# Patient Record
Sex: Male | Born: 1935 | Race: White | Hispanic: No | Marital: Married | Smoking: Never smoker
Health system: Southern US, Community
[De-identification: ages and names within clinical notes are randomized; demographics above are authoritative.]

## PROBLEM LIST (undated history)

## (undated) DIAGNOSIS — R0989 Other specified symptoms and signs involving the circulatory and respiratory systems: Secondary | ICD-10-CM

## (undated) DIAGNOSIS — C4491 Basal cell carcinoma of skin, unspecified: Secondary | ICD-10-CM

## (undated) DIAGNOSIS — I350 Nonrheumatic aortic (valve) stenosis: Secondary | ICD-10-CM

## (undated) DIAGNOSIS — C61 Malignant neoplasm of prostate: Secondary | ICD-10-CM

## (undated) DIAGNOSIS — I25118 Atherosclerotic heart disease of native coronary artery with other forms of angina pectoris: Secondary | ICD-10-CM

## (undated) DIAGNOSIS — E785 Hyperlipidemia, unspecified: Secondary | ICD-10-CM

## (undated) DIAGNOSIS — N1831 Chronic kidney disease, stage 3a: Secondary | ICD-10-CM

## (undated) DIAGNOSIS — D374 Neoplasm of uncertain behavior of colon: Secondary | ICD-10-CM

## (undated) DIAGNOSIS — C439 Malignant melanoma of skin, unspecified: Secondary | ICD-10-CM

## (undated) HISTORY — DX: Other specified symptoms and signs involving the circulatory and respiratory systems: R09.89

## (undated) HISTORY — DX: Nonrheumatic aortic (valve) stenosis: I35.0

## (undated) HISTORY — PX: COLONOSCOPY: SHX174

## (undated) HISTORY — DX: Atherosclerotic heart disease of native coronary artery with other forms of angina pectoris: I25.118

## (undated) HISTORY — DX: Malignant melanoma of skin, unspecified: C43.9

## (undated) HISTORY — PX: CARDIAC CATHETERIZATION: SHX172

## (undated) HISTORY — PX: INSERTION PROSTATE RADIATION SEED: SUR718

## (undated) HISTORY — DX: Basal cell carcinoma of skin, unspecified: C44.91

## (undated) HISTORY — PX: TONSILLECTOMY: SUR1361

## (undated) HISTORY — DX: Malignant neoplasm of prostate: C61

## (undated) HISTORY — DX: Hyperlipidemia, unspecified: E78.5

## (undated) HISTORY — DX: Chronic kidney disease, stage 3a: N18.31

## (undated) HISTORY — PX: ROTATOR CUFF REPAIR: SHX139

## (undated) HISTORY — DX: Neoplasm of uncertain behavior of colon: D37.4

---

## 2004-10-21 ENCOUNTER — Ambulatory Visit: Payer: Self-pay | Admitting: Unknown Physician Specialty

## 2005-09-11 ENCOUNTER — Ambulatory Visit: Payer: Self-pay | Admitting: Ophthalmology

## 2007-04-27 ENCOUNTER — Emergency Department: Payer: Self-pay | Admitting: Emergency Medicine

## 2008-01-15 ENCOUNTER — Ambulatory Visit: Payer: Self-pay | Admitting: Unknown Physician Specialty

## 2008-10-22 ENCOUNTER — Ambulatory Visit: Payer: Self-pay | Admitting: Radiation Oncology

## 2008-11-12 ENCOUNTER — Ambulatory Visit: Payer: Self-pay | Admitting: Radiation Oncology

## 2008-11-21 ENCOUNTER — Ambulatory Visit: Payer: Self-pay | Admitting: Radiation Oncology

## 2008-12-01 ENCOUNTER — Ambulatory Visit: Payer: Self-pay | Admitting: Radiation Oncology

## 2008-12-08 ENCOUNTER — Ambulatory Visit: Payer: Self-pay | Admitting: Cardiology

## 2008-12-08 ENCOUNTER — Ambulatory Visit: Payer: Self-pay | Admitting: Urology

## 2008-12-15 ENCOUNTER — Ambulatory Visit: Payer: Self-pay | Admitting: Urology

## 2008-12-22 ENCOUNTER — Ambulatory Visit: Payer: Self-pay | Admitting: Radiation Oncology

## 2009-01-21 ENCOUNTER — Ambulatory Visit: Payer: Self-pay | Admitting: Radiation Oncology

## 2009-03-24 ENCOUNTER — Ambulatory Visit: Payer: Self-pay | Admitting: Radiation Oncology

## 2009-04-16 ENCOUNTER — Ambulatory Visit: Payer: Self-pay | Admitting: Radiation Oncology

## 2009-04-23 ENCOUNTER — Ambulatory Visit: Payer: Self-pay | Admitting: Radiation Oncology

## 2009-09-21 ENCOUNTER — Ambulatory Visit: Payer: Self-pay | Admitting: Radiation Oncology

## 2009-10-14 ENCOUNTER — Ambulatory Visit: Payer: Self-pay | Admitting: Radiation Oncology

## 2009-10-22 ENCOUNTER — Ambulatory Visit: Payer: Self-pay | Admitting: Radiation Oncology

## 2010-03-24 ENCOUNTER — Ambulatory Visit: Payer: Self-pay | Admitting: Radiation Oncology

## 2010-04-05 ENCOUNTER — Ambulatory Visit: Payer: Self-pay | Admitting: Internal Medicine

## 2010-04-15 ENCOUNTER — Ambulatory Visit: Payer: Self-pay | Admitting: Radiation Oncology

## 2010-04-23 ENCOUNTER — Ambulatory Visit: Payer: Self-pay | Admitting: Radiation Oncology

## 2010-09-22 HISTORY — PX: MELANOMA EXCISION: SHX5266

## 2011-04-14 ENCOUNTER — Ambulatory Visit: Payer: Self-pay | Admitting: Radiation Oncology

## 2011-04-15 LAB — PSA: PSA: 0.7 ng/mL (ref 0.0–4.0)

## 2011-04-24 ENCOUNTER — Ambulatory Visit: Payer: Self-pay | Admitting: Radiation Oncology

## 2011-05-23 ENCOUNTER — Ambulatory Visit: Payer: Self-pay | Admitting: Cardiology

## 2012-04-15 ENCOUNTER — Ambulatory Visit: Payer: Self-pay | Admitting: Radiation Oncology

## 2012-04-16 LAB — PSA: PSA: 0.9 ng/mL (ref 0.0–4.0)

## 2012-04-23 ENCOUNTER — Ambulatory Visit: Payer: Self-pay | Admitting: Radiation Oncology

## 2013-02-21 ENCOUNTER — Ambulatory Visit: Payer: Self-pay | Admitting: Unknown Physician Specialty

## 2013-04-14 ENCOUNTER — Ambulatory Visit: Payer: Self-pay | Admitting: Radiation Oncology

## 2013-04-23 ENCOUNTER — Ambulatory Visit: Payer: Self-pay | Admitting: Radiation Oncology

## 2014-02-02 ENCOUNTER — Ambulatory Visit: Payer: Self-pay | Admitting: Ophthalmology

## 2014-02-09 ENCOUNTER — Ambulatory Visit: Payer: Self-pay | Admitting: Ophthalmology

## 2014-04-14 ENCOUNTER — Ambulatory Visit: Payer: Self-pay | Admitting: Radiation Oncology

## 2014-04-23 ENCOUNTER — Ambulatory Visit: Payer: Self-pay | Admitting: Radiation Oncology

## 2014-07-13 ENCOUNTER — Ambulatory Visit: Payer: Self-pay | Admitting: Surgery

## 2014-08-12 ENCOUNTER — Ambulatory Visit: Payer: Self-pay | Admitting: Surgery

## 2014-08-18 ENCOUNTER — Ambulatory Visit: Payer: Self-pay | Admitting: Surgery

## 2014-11-14 NOTE — Op Note (Signed)
PATIENT NAME:  James West, James West MR#:  [id number] DATE OF BIRTH:  10/12/1931  DATE OF PROCEDURE:  02/09/2014  PREOPERATIVE DIAGNOSIS:  Cataract, right eye.   POSTOPERATIVE DIAGNOSIS:  Cataract, right eye.  PROCEDURE PERFORMED:  Extracapsular cataract extraction using phacoemulsification with placement of an Alcon [id number], 17.5-diopter posterior chamber lens, serial [id number].  SURGEON:  Loura Back. Tameah Mihalko, MD  ASSISTANT:  None.  ANESTHESIA:  4% lidocaine and 0.75% Marcaine in a 50/50 mixture with 10 units/mL of Hylenex added, given as a peribulbar.   ANESTHESIOLOGIST:  Dr. Benjamine Mola  COMPLICATIONS:  None.  ESTIMATED BLOOD LOSS:  Less than 1 ml.  DESCRIPTION OF PROCEDURE:  The patient was brought to the operating room and given a peribulbar block.  The patient was then prepped and draped in the usual fashion.  The vertical rectus muscles were imbricated using 5-0 silk sutures.  These sutures were then clamped to the sterile drapes as bridle sutures.  A limbal peritomy was performed extending two clock hours and hemostasis was obtained with cautery.  A partial thickness scleral groove was made at the surgical limbus and dissected anteriorly in a lamellar dissection using an Alcon crescent knife.  The anterior chamber was entered superonasally with a Superblade and through the lamellar dissection with a 2.6 mm keratome.  DisCoVisc was used to replace the aqueous and a continuous tear capsulorrhexis was carried out.  Hydrodissection and hydrodelineation were carried out with balanced salt and a 27 gauge canula.  The nucleus was rotated to confirm the effectiveness of the hydrodissection.  Phacoemulsification was carried out using a divide-and-conquer technique.  Total ultrasound time was 59.7 seconds with an average power of 23.7 percent and CDE of 24.89.  Irrigation/aspiration was used to remove the residual cortex.  DisCoVisc was used to inflate the capsule and the internal incision was enlarged  to 3 mm with the crescent knife.  The intraocular lens was folded and inserted into the capsular bag using the AcrySert delivery system. Irrigation/aspiration was used to remove the residual DisCoVisc.  Miostat was injected into the anterior chamber through the paracentesis track to inflate the anterior chamber and induce miosis. A tenth of a milliliter of cefuroxime was injected via the paracentesis tract containing 1 mg of drug. The wound was checked for leaks and none were found. The conjunctiva was closed with cautery and the bridle sutures were removed.  Two drops of 0.3% Vigamox were placed on the eye.   An eye shield was placed on the eye.  The patient was discharged to the recovery room in good condition.  ____________________________ Loura Back Tahani Potier, MD sad:sb D: 02/09/2014 10:50:32 ET T: 02/09/2014 11:11:35 ET JOB#: [id number]  cc: Remo Lipps A. Nevia Henkin, MD, <Dictator> Martie Lee MD ELECTRONICALLY SIGNED 02/09/2014 12:35

## 2014-11-22 NOTE — Op Note (Signed)
PATIENT NAME:  James West, James West MR#:  [id number] DATE OF BIRTH:  04/24/1932  DATE OF PROCEDURE:  08/18/2014  PREOPERATIVE DIAGNOSIS: Large rotator cuff tear, right shoulder.   POSTOPERATIVE DIAGNOSIS: Large rotator cuff tear, right shoulder.   PROCEDURE: Arthroscopic debridement, arthroscopic subacromial decompression, mini open repair of large rotator cuff tear, and biceps tenodesis right shoulder.   SURGEON: Pascal Lux, M.D.    ASSISTANT: Francena Hanly, NP.   ANESTHESIA: General endotracheal with an interscalene block placed preoperatively by the anesthesiologist.   FINDINGS: As noted above. The labrum demonstrated some mild fraying, but otherwise was intact. The biceps tendon demonstrated some tendinopathic changes, as well as some medial subluxation. The articular surfaces of the glenoid and humerus both were in satisfactory condition. There was a full-thickness tear involving the entire supraspinatus tendon, the anterior 60% to 70% of the infraspinatus tendon, and the superior insertional fibers of the subscapularis tendon.   COMPLICATIONS: None.   ESTIMATED BLOOD LOSS: 25 mL.  TOTAL FLUIDS: 700 mL of crystalloid.   TOURNIQUET: None.   DRAINS: None.   CLOSURE: Staples.   BRIEF CLINICAL NOTE: The patient is an 81 year old male with a several month history of right shoulder pain and weakness following an injury. Following the injury, he was unable to elevate his arm. His history and examination were consistent with a large rotator cuff tear confirmed by MRI scan. He presents at this time for arthroscopy and repair of the rotator cuff tear.   DESCRIPTION OF PROCEDURE: The patient underwent placement of an interscalene block in the preoperative holding area before he was brought into the operating room and lain in the supine position. After adequate general endotracheal intubation and anesthesia were obtained, he was repositioned in the beach chair position using the beach chair  positioner. The right shoulder and upper extremity were prepped with ChloraPrep solution before being draped sterilely. Preoperative antibiotics were administered. The expected portal sites and incision site were injected with 0.5% Sensorcaine with epinephrine before the camera was placed in the posterior portal. The glenohumeral joint was thoroughly inspected with the findings as described above. An anterior portal was created using an outside-in technique. The labrum was carefully probed, again confirming the above-noted findings. There was extensive synovitis anteriorly, superiorly, and posterosuperiorly. These areas were debrided back to stable margins using the full radius resector. In addition, the frayed margins of the rotator cuff also were debrided using the full radius resector. The ArthroCare wand was inserted to obtain hemostasis. The ArthroCare wand also was used to release the biceps tendon from its labral attachment as it demonstrated the above-noted tendinopathic changes and medial subluxation. Inspection of the subscapularis tendon demonstrated only the top 10% of it having torn; therefore, a formal repair was not deemed necessary. The instruments were removed from the joint after suctioning the excess fluid.   The camera was repositioned through the posterior portal into the subacromial space. A separate lateral portal was created and the full radius resector introduced. A subtotal bursectomy was performed before the ArthroCare wand was inserted to remove the periosteal tissues off the undersurface of the anterior third of the acromion. It also was used to recess the coracoacromial ligament from its attachment along the anterior and lateral margins of the acromion. The 4 mm acromionizer bur was inserted to complete the decompression by removing the undersurface of the anterior third of the acromion. The shaver was reintroduced to remove residual bony debris before the ArthroCare wand was  reinserted to obtain hemostasis.  The instruments were then removed from the subacromial space after suctioning the excess fluid.   An approximately 4 to 5 cm incision was made over the anterolateral aspect of the shoulder beginning at the anterolateral corner of the acromion, extending distally in line with the bicipital groove. The incision was carried down through the subcutaneous tissues to expose the deltoid fascia. The raphe between the anterior and middle thirds was developed to provide access into the subacromial space. Additional bursal tissues were debrided to better visualize the rotator cuff tear. The tear measured approximately 3 to 3.5 cm in anterior to posterior dimension and 3 cm in medial lateral dimension. The tear was primarily an L-shaped tear, based anteriorly. The posterior flap was grasped with an Allis clamp and pulled forward. Several tagging sutures were passed through it near the anterolateral corner to better control the rotator cuff. Several additional tagging sutures were placed anteriorly. An apical stitch was placed and tied securely to help close down the defect. The exposed greater tuberosity was roughened with a rongeur before two 2.9 mm Biomet JuggerKnot anchors were inserted. Both sets of sutures from the more posterior anchor were passed through the flap, fairly posteriorly, and tied securely to help pull the rotator cuff tissue forward and to relieve tension on the more anterior sutures. Once the sutures from the second anchor were placed through the flap, whereas the other side of sutures was divided so that one went through the flap posteriorly, and the other went through the flap anteriorly to complete a side-to-side repair. Each of these sutures were tied down to effect a visually excellent repair. The repair appeared to be watertight. For reinforcement, these sutures were brought back out laterally through bone tunnels and tied over a bony bridge to further reinforce the  repair and create a double row closure. Several additional #0 Ethibond interrupted sutures were used to further reinforce the repair.   Attention was directed to the bicipital groove. The bicipital groove was identified by palpation and opened for 1.5 cm. The biceps tendon was retrieved through this defect before the floor of the bicipital groove was roughened with a curette. A third 2.9 mm JuggerKnot anchor was inserted and both sets of sutures were passed through the biceps tendon to effect the tenodesis. The excess tendon stump proximally was removed with a 15 blade before the bicipital sheath was reapproximated using #0 Ethibond interrupted sutures, incorporating the biceps tendon to further reinforce the tenodesis.   The wound was copiously irrigated with sterile saline solution before the deltoid raphe was reapproximated using 2-0 Vicryl interrupted sutures. The subcutaneous tissues were closed in two layers using 2-0 Vicryl interrupted sutures before the skin was closed using staples. The portal sites also were closed using staples before a sterile bulky dressing was applied to the shoulder. The arm was placed into a gunslinger shoulder immobilizer before the patient was awakened, extubated, and returned to the recovery room in satisfactory condition after tolerating the procedure well.    ____________________________ J. Dorien Chihuahua, MD jjp:JT D: 08/18/2014 11:19:32 ET T: 08/18/2014 14:21:10 ET JOB#: [id number]  cc: Pascal Lux, MD, <Dictator> JEFF Robby Sermon MD ELECTRONICALLY SIGNED 08/18/2014 17:40

## 2015-11-08 DIAGNOSIS — R739 Hyperglycemia, unspecified: Secondary | ICD-10-CM | POA: Diagnosis not present

## 2015-11-08 DIAGNOSIS — E7800 Pure hypercholesterolemia, unspecified: Secondary | ICD-10-CM | POA: Diagnosis not present

## 2015-11-22 DIAGNOSIS — E7800 Pure hypercholesterolemia, unspecified: Secondary | ICD-10-CM | POA: Diagnosis not present

## 2015-11-22 DIAGNOSIS — R739 Hyperglycemia, unspecified: Secondary | ICD-10-CM | POA: Diagnosis not present

## 2015-11-22 DIAGNOSIS — Z0000 Encounter for general adult medical examination without abnormal findings: Secondary | ICD-10-CM | POA: Diagnosis not present

## 2015-11-25 DIAGNOSIS — Z85828 Personal history of other malignant neoplasm of skin: Secondary | ICD-10-CM | POA: Diagnosis not present

## 2015-11-25 DIAGNOSIS — Z08 Encounter for follow-up examination after completed treatment for malignant neoplasm: Secondary | ICD-10-CM | POA: Diagnosis not present

## 2015-11-25 DIAGNOSIS — N138 Other obstructive and reflux uropathy: Secondary | ICD-10-CM | POA: Diagnosis not present

## 2015-11-25 DIAGNOSIS — Z7982 Long term (current) use of aspirin: Secondary | ICD-10-CM | POA: Diagnosis not present

## 2015-11-25 DIAGNOSIS — Z8546 Personal history of malignant neoplasm of prostate: Secondary | ICD-10-CM | POA: Diagnosis not present

## 2015-11-25 DIAGNOSIS — N320 Bladder-neck obstruction: Secondary | ICD-10-CM | POA: Diagnosis not present

## 2015-11-25 DIAGNOSIS — M545 Low back pain: Secondary | ICD-10-CM | POA: Diagnosis not present

## 2015-11-25 DIAGNOSIS — R351 Nocturia: Secondary | ICD-10-CM | POA: Diagnosis not present

## 2015-11-25 DIAGNOSIS — R339 Retention of urine, unspecified: Secondary | ICD-10-CM | POA: Diagnosis not present

## 2015-12-30 DIAGNOSIS — J0100 Acute maxillary sinusitis, unspecified: Secondary | ICD-10-CM | POA: Diagnosis not present

## 2016-02-22 IMAGING — MR MRI OF THE RIGHT SHOULDER WITHOUT CONTRAST
5 series · 40 of 40 positions shown · non-contrast
Comparison: None.

CLINICAL DATA: Right shoulder pain for 2 weeks since a fall with
limited range of motion.

EXAM:
MRI OF THE RIGHT SHOULDER WITHOUT CONTRAST
TECHNIQUE: Multiplanar, multisequence MR imaging of the shoulder was performed.
No intravenous contrast was administered.

[Series 5: T2 fat-sat · axial · 4.0mm · 0.62mm/px · z∈[-27,+65]mm · 10 of 22 slices shown (1 of 3)]
[im 1/22]
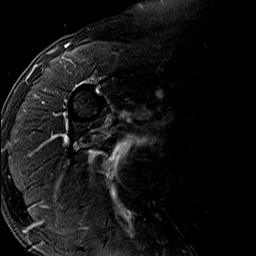
[im 3/22]
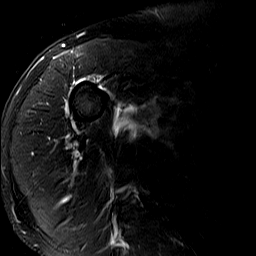
[im 5/22]
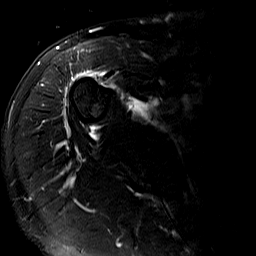
[im 8/22]
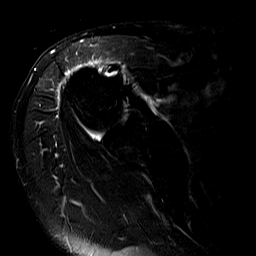
[im 10/22]
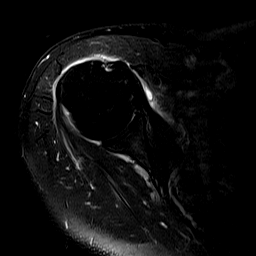
[im 12/22]
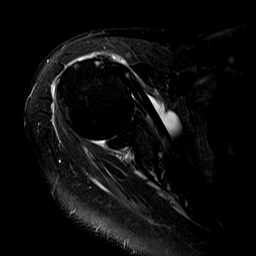
[im 15/22]
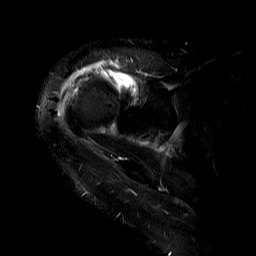
[im 17/22]
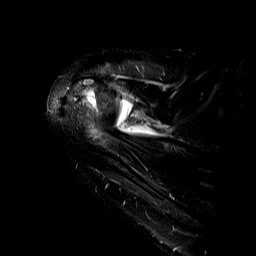
[im 19/22]
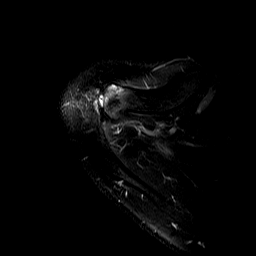
[im 22/22]
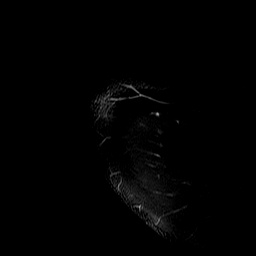

[Series 6: T2 fat-sat · oblique · 4.0mm · 0.62mm/px · 8 of 21 slices shown (2 of 3)]
[im 1/21]
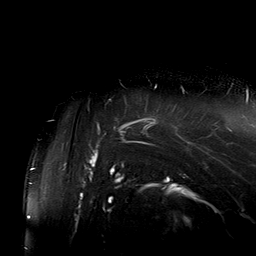
[im 3/21]
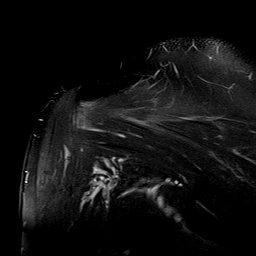
[im 6/21]
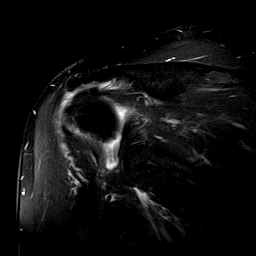
[im 9/21]
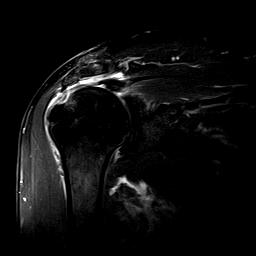
[im 12/21]
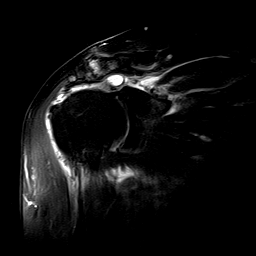
[im 15/21]
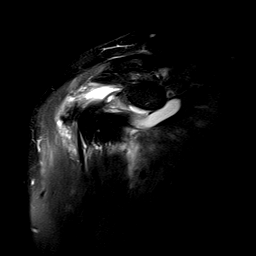
[im 18/21]
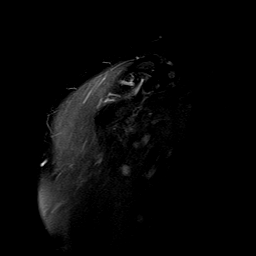
[im 21/21]
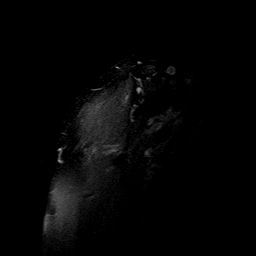

[Series 7: PD · oblique · 4.0mm · 0.62mm/px · 8 of 21 slices shown]
[im 1/21]
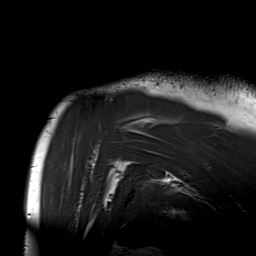
[im 3/21]
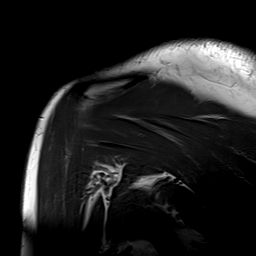
[im 6/21]
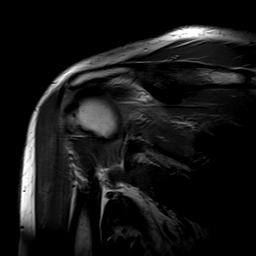
[im 9/21]
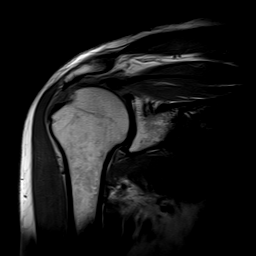
[im 12/21]
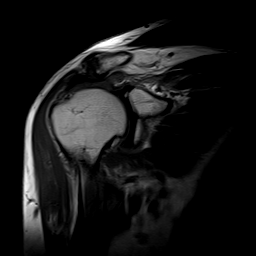
[im 15/21]
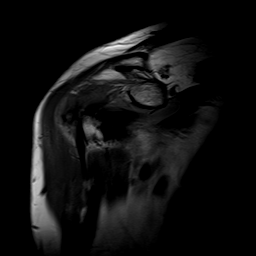
[im 18/21]
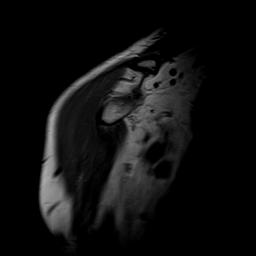
[im 21/21]
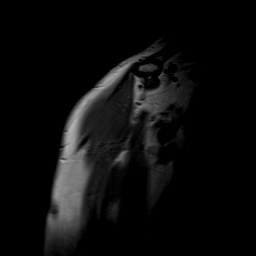

[Series 8: T1 · oblique · 4.0mm · 0.62mm/px · 7 of 19 slices shown]
[im 1/19]
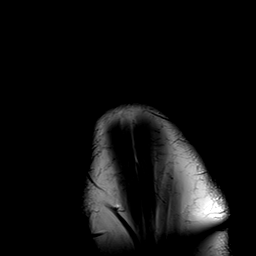
[im 4/19]
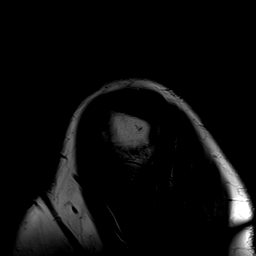
[im 7/19]
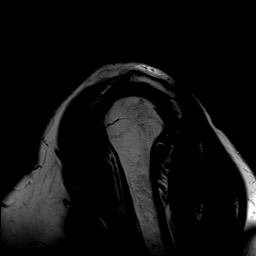
[im 10/19]
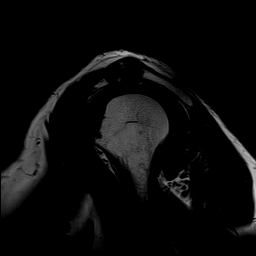
[im 13/19]
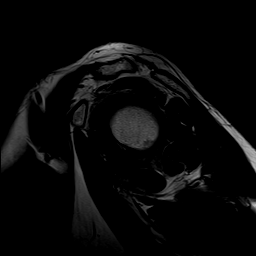
[im 16/19]
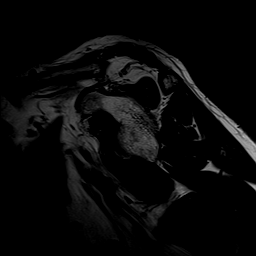
[im 19/19]
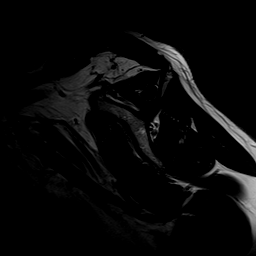

[Series 9: T2 fat-sat · oblique · 4.0mm · 0.62mm/px · 7 of 19 slices shown (3 of 3)]
[im 1/19]
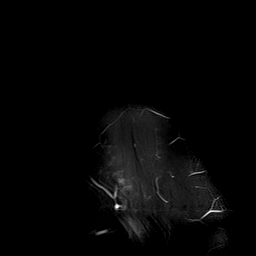
[im 4/19]
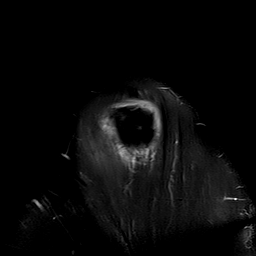
[im 7/19]
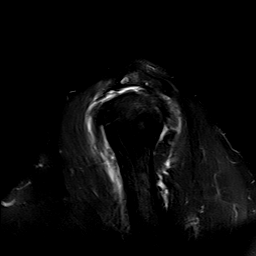
[im 10/19]
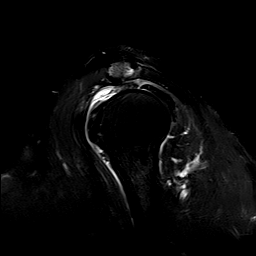
[im 13/19]
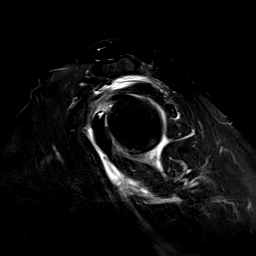
[im 16/19]
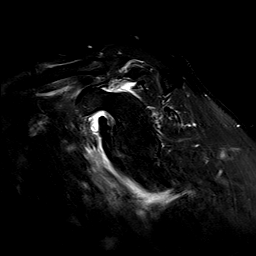
[im 19/19]
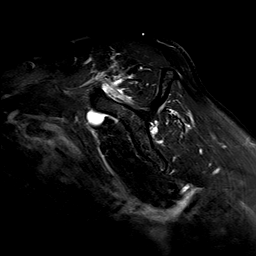

[40 of 40 positions shown; findings below may reference images not displayed]

FINDINGS: Rotator cuff: The patient has a complete supraspinatus tendon tear
with retraction just medial to the top of the humeral head, 3-4 cm.
The infraspinatus and subscapularis are intact.

Muscles:  No atrophy or focal lesion.

Biceps long head:  Intact.

Acromioclavicular Joint: Moderate to advanced degenerative change is
present.

Glenohumeral Joint: Unremarkable.

Labrum:  Intact.

Bones: The acromion is type 1 with a subacromial spur. No fracture
or worrisome marrow lesion. A large volume of fluid is present in
the subacromial/subdeltoid bursa.
IMPRESSION: Complete supraspinatus tendon tear with 3-4 cm retraction but no
atrophy.

Acromioclavicular osteoarthritis and subacromial spurring.

Subacromial/subdeltoid bursitis.

## 2016-04-14 DIAGNOSIS — Z23 Encounter for immunization: Secondary | ICD-10-CM | POA: Diagnosis not present

## 2016-08-24 DIAGNOSIS — H35373 Puckering of macula, bilateral: Secondary | ICD-10-CM | POA: Diagnosis not present

## 2016-10-19 DIAGNOSIS — H40001 Preglaucoma, unspecified, right eye: Secondary | ICD-10-CM | POA: Diagnosis not present

## 2016-10-26 DIAGNOSIS — H35373 Puckering of macula, bilateral: Secondary | ICD-10-CM | POA: Diagnosis not present

## 2017-01-31 DIAGNOSIS — R339 Retention of urine, unspecified: Secondary | ICD-10-CM | POA: Diagnosis not present

## 2017-01-31 DIAGNOSIS — N320 Bladder-neck obstruction: Secondary | ICD-10-CM | POA: Diagnosis not present

## 2017-01-31 DIAGNOSIS — Z8546 Personal history of malignant neoplasm of prostate: Secondary | ICD-10-CM | POA: Diagnosis not present

## 2017-01-31 DIAGNOSIS — Z08 Encounter for follow-up examination after completed treatment for malignant neoplasm: Secondary | ICD-10-CM | POA: Diagnosis not present

## 2017-02-12 DIAGNOSIS — R7301 Impaired fasting glucose: Secondary | ICD-10-CM | POA: Diagnosis not present

## 2017-02-12 DIAGNOSIS — E7800 Pure hypercholesterolemia, unspecified: Secondary | ICD-10-CM | POA: Diagnosis not present

## 2017-02-15 DIAGNOSIS — R739 Hyperglycemia, unspecified: Secondary | ICD-10-CM | POA: Diagnosis not present

## 2017-02-15 DIAGNOSIS — Z0000 Encounter for general adult medical examination without abnormal findings: Secondary | ICD-10-CM | POA: Diagnosis not present

## 2017-04-13 DIAGNOSIS — Z23 Encounter for immunization: Secondary | ICD-10-CM | POA: Diagnosis not present

## 2017-04-24 DIAGNOSIS — H40001 Preglaucoma, unspecified, right eye: Secondary | ICD-10-CM | POA: Diagnosis not present

## 2017-05-08 DIAGNOSIS — H40001 Preglaucoma, unspecified, right eye: Secondary | ICD-10-CM | POA: Diagnosis not present

## 2017-11-05 DIAGNOSIS — H40001 Preglaucoma, unspecified, right eye: Secondary | ICD-10-CM | POA: Diagnosis not present

## 2017-11-12 DIAGNOSIS — H35373 Puckering of macula, bilateral: Secondary | ICD-10-CM | POA: Diagnosis not present

## 2018-02-26 DIAGNOSIS — E7800 Pure hypercholesterolemia, unspecified: Secondary | ICD-10-CM | POA: Diagnosis not present

## 2018-02-26 DIAGNOSIS — R739 Hyperglycemia, unspecified: Secondary | ICD-10-CM | POA: Diagnosis not present

## 2018-02-26 DIAGNOSIS — N183 Chronic kidney disease, stage 3 (moderate): Secondary | ICD-10-CM | POA: Diagnosis not present

## 2018-02-26 DIAGNOSIS — Z125 Encounter for screening for malignant neoplasm of prostate: Secondary | ICD-10-CM | POA: Diagnosis not present

## 2018-02-28 DIAGNOSIS — E7800 Pure hypercholesterolemia, unspecified: Secondary | ICD-10-CM | POA: Diagnosis not present

## 2018-02-28 DIAGNOSIS — Z125 Encounter for screening for malignant neoplasm of prostate: Secondary | ICD-10-CM | POA: Diagnosis not present

## 2018-02-28 DIAGNOSIS — R739 Hyperglycemia, unspecified: Secondary | ICD-10-CM | POA: Diagnosis not present

## 2018-02-28 DIAGNOSIS — N183 Chronic kidney disease, stage 3 (moderate): Secondary | ICD-10-CM | POA: Diagnosis not present

## 2018-02-28 DIAGNOSIS — Z0000 Encounter for general adult medical examination without abnormal findings: Secondary | ICD-10-CM | POA: Diagnosis not present

## 2018-04-26 DIAGNOSIS — Z23 Encounter for immunization: Secondary | ICD-10-CM | POA: Diagnosis not present

## 2018-05-13 DIAGNOSIS — H40001 Preglaucoma, unspecified, right eye: Secondary | ICD-10-CM | POA: Diagnosis not present

## 2019-02-28 DIAGNOSIS — N183 Chronic kidney disease, stage 3 (moderate): Secondary | ICD-10-CM | POA: Diagnosis not present

## 2019-02-28 DIAGNOSIS — E7800 Pure hypercholesterolemia, unspecified: Secondary | ICD-10-CM | POA: Diagnosis not present

## 2019-02-28 DIAGNOSIS — Z125 Encounter for screening for malignant neoplasm of prostate: Secondary | ICD-10-CM | POA: Diagnosis not present

## 2019-02-28 DIAGNOSIS — R739 Hyperglycemia, unspecified: Secondary | ICD-10-CM | POA: Diagnosis not present

## 2019-03-03 DIAGNOSIS — Z1331 Encounter for screening for depression: Secondary | ICD-10-CM | POA: Diagnosis not present

## 2019-03-03 DIAGNOSIS — Z0000 Encounter for general adult medical examination without abnormal findings: Secondary | ICD-10-CM | POA: Diagnosis not present

## 2019-03-03 DIAGNOSIS — E7800 Pure hypercholesterolemia, unspecified: Secondary | ICD-10-CM | POA: Diagnosis not present

## 2019-03-03 DIAGNOSIS — N183 Chronic kidney disease, stage 3 (moderate): Secondary | ICD-10-CM | POA: Diagnosis not present

## 2019-03-03 DIAGNOSIS — R739 Hyperglycemia, unspecified: Secondary | ICD-10-CM | POA: Diagnosis not present

## 2019-03-03 DIAGNOSIS — C61 Malignant neoplasm of prostate: Secondary | ICD-10-CM | POA: Diagnosis not present

## 2019-03-03 DIAGNOSIS — Z125 Encounter for screening for malignant neoplasm of prostate: Secondary | ICD-10-CM | POA: Diagnosis not present

## 2019-04-11 DIAGNOSIS — Z23 Encounter for immunization: Secondary | ICD-10-CM | POA: Diagnosis not present

## 2019-07-31 ENCOUNTER — Ambulatory Visit: Payer: PPO | Attending: Internal Medicine

## 2019-07-31 DIAGNOSIS — Z20822 Contact with and (suspected) exposure to covid-19: Secondary | ICD-10-CM

## 2019-08-02 LAB — NOVEL CORONAVIRUS, NAA: SARS-CoV-2, NAA: DETECTED — AB

## 2019-08-03 ENCOUNTER — Telehealth: Payer: Self-pay | Admitting: Infectious Diseases

## 2019-08-03 NOTE — Telephone Encounter (Signed)
Called to discuss with patient about Covid symptoms and the use of bamlanivimab, a monoclonal antibody infusion for those with mild to moderate Covid symptoms and at a high risk of hospitalization.  Pt is qualified for this infusion at the [facility] infusion center due to Age > 76   He said he is not really having any symptoms at this time. The only symptom he had was a "touch of a chill" last week when he. Declined monoclonal Ab.   His wife has parkinson's and no other symptoms at this time - discussed incubation and to be watchful for her x 14 days from when he started showing symptoms.   Also discussed vaccination post COVID - would recommend waiting at least 30 days minimum prior to researching updates on timing of vaccine post COVID infection.

## 2019-08-15 DIAGNOSIS — R1013 Epigastric pain: Secondary | ICD-10-CM | POA: Diagnosis not present

## 2019-08-27 DIAGNOSIS — C44319 Basal cell carcinoma of skin of other parts of face: Secondary | ICD-10-CM | POA: Diagnosis not present

## 2019-08-27 DIAGNOSIS — D485 Neoplasm of uncertain behavior of skin: Secondary | ICD-10-CM | POA: Diagnosis not present

## 2019-08-27 DIAGNOSIS — C44719 Basal cell carcinoma of skin of left lower limb, including hip: Secondary | ICD-10-CM | POA: Diagnosis not present

## 2019-08-27 DIAGNOSIS — C44519 Basal cell carcinoma of skin of other part of trunk: Secondary | ICD-10-CM | POA: Diagnosis not present

## 2019-08-27 DIAGNOSIS — D0439 Carcinoma in situ of skin of other parts of face: Secondary | ICD-10-CM | POA: Diagnosis not present

## 2019-08-27 DIAGNOSIS — L821 Other seborrheic keratosis: Secondary | ICD-10-CM | POA: Diagnosis not present

## 2019-08-27 DIAGNOSIS — C4372 Malignant melanoma of left lower limb, including hip: Secondary | ICD-10-CM | POA: Diagnosis not present

## 2019-09-18 DIAGNOSIS — D485 Neoplasm of uncertain behavior of skin: Secondary | ICD-10-CM | POA: Diagnosis not present

## 2019-09-18 DIAGNOSIS — C4372 Malignant melanoma of left lower limb, including hip: Secondary | ICD-10-CM | POA: Diagnosis not present

## 2019-09-24 DIAGNOSIS — Z4801 Encounter for change or removal of surgical wound dressing: Secondary | ICD-10-CM | POA: Diagnosis not present

## 2019-10-02 DIAGNOSIS — C44519 Basal cell carcinoma of skin of other part of trunk: Secondary | ICD-10-CM | POA: Diagnosis not present

## 2019-10-02 DIAGNOSIS — C44719 Basal cell carcinoma of skin of left lower limb, including hip: Secondary | ICD-10-CM | POA: Diagnosis not present

## 2019-10-02 DIAGNOSIS — C44319 Basal cell carcinoma of skin of other parts of face: Secondary | ICD-10-CM | POA: Diagnosis not present

## 2019-10-08 DIAGNOSIS — Z23 Encounter for immunization: Secondary | ICD-10-CM | POA: Diagnosis not present

## 2019-10-09 DIAGNOSIS — C44319 Basal cell carcinoma of skin of other parts of face: Secondary | ICD-10-CM | POA: Diagnosis not present

## 2019-10-27 DIAGNOSIS — D0439 Carcinoma in situ of skin of other parts of face: Secondary | ICD-10-CM | POA: Diagnosis not present

## 2019-11-18 DIAGNOSIS — L249 Irritant contact dermatitis, unspecified cause: Secondary | ICD-10-CM | POA: Diagnosis not present

## 2020-01-09 DIAGNOSIS — Z08 Encounter for follow-up examination after completed treatment for malignant neoplasm: Secondary | ICD-10-CM | POA: Diagnosis not present

## 2020-01-09 DIAGNOSIS — Z85828 Personal history of other malignant neoplasm of skin: Secondary | ICD-10-CM | POA: Diagnosis not present

## 2020-01-09 DIAGNOSIS — D485 Neoplasm of uncertain behavior of skin: Secondary | ICD-10-CM | POA: Diagnosis not present

## 2020-01-09 DIAGNOSIS — L821 Other seborrheic keratosis: Secondary | ICD-10-CM | POA: Diagnosis not present

## 2020-01-09 DIAGNOSIS — L820 Inflamed seborrheic keratosis: Secondary | ICD-10-CM | POA: Diagnosis not present

## 2020-01-09 DIAGNOSIS — C44519 Basal cell carcinoma of skin of other part of trunk: Secondary | ICD-10-CM | POA: Diagnosis not present

## 2020-01-09 DIAGNOSIS — Z85820 Personal history of malignant melanoma of skin: Secondary | ICD-10-CM | POA: Diagnosis not present

## 2020-01-09 DIAGNOSIS — C44612 Basal cell carcinoma of skin of right upper limb, including shoulder: Secondary | ICD-10-CM | POA: Diagnosis not present

## 2020-01-12 DIAGNOSIS — C61 Malignant neoplasm of prostate: Secondary | ICD-10-CM | POA: Diagnosis not present

## 2020-01-12 DIAGNOSIS — R634 Abnormal weight loss: Secondary | ICD-10-CM | POA: Diagnosis not present

## 2020-01-12 DIAGNOSIS — C4372 Malignant melanoma of left lower limb, including hip: Secondary | ICD-10-CM | POA: Diagnosis not present

## 2020-01-27 DIAGNOSIS — C44519 Basal cell carcinoma of skin of other part of trunk: Secondary | ICD-10-CM | POA: Diagnosis not present

## 2020-01-27 DIAGNOSIS — C44612 Basal cell carcinoma of skin of right upper limb, including shoulder: Secondary | ICD-10-CM | POA: Diagnosis not present

## 2020-02-26 DIAGNOSIS — Z125 Encounter for screening for malignant neoplasm of prostate: Secondary | ICD-10-CM | POA: Diagnosis not present

## 2020-02-26 DIAGNOSIS — E7800 Pure hypercholesterolemia, unspecified: Secondary | ICD-10-CM | POA: Diagnosis not present

## 2020-02-26 DIAGNOSIS — R739 Hyperglycemia, unspecified: Secondary | ICD-10-CM | POA: Diagnosis not present

## 2020-02-26 DIAGNOSIS — N183 Chronic kidney disease, stage 3 unspecified: Secondary | ICD-10-CM | POA: Diagnosis not present

## 2020-03-04 DIAGNOSIS — Z0000 Encounter for general adult medical examination without abnormal findings: Secondary | ICD-10-CM | POA: Diagnosis not present

## 2020-03-04 DIAGNOSIS — N1831 Chronic kidney disease, stage 3a: Secondary | ICD-10-CM | POA: Diagnosis not present

## 2020-03-04 DIAGNOSIS — Z1331 Encounter for screening for depression: Secondary | ICD-10-CM | POA: Diagnosis not present

## 2020-03-04 DIAGNOSIS — Z125 Encounter for screening for malignant neoplasm of prostate: Secondary | ICD-10-CM | POA: Diagnosis not present

## 2020-03-18 ENCOUNTER — Other Ambulatory Visit: Payer: Self-pay

## 2020-03-18 ENCOUNTER — Emergency Department
Admission: EM | Admit: 2020-03-18 | Discharge: 2020-03-18 | Disposition: A | Discharge status: Home / Self Care | Payer: PPO | Attending: Emergency Medicine | Admitting: Emergency Medicine

## 2020-03-18 DIAGNOSIS — Z5321 Procedure and treatment not carried out due to patient leaving prior to being seen by health care provider: Secondary | ICD-10-CM | POA: Insufficient documentation

## 2020-03-18 DIAGNOSIS — R531 Weakness: Secondary | ICD-10-CM | POA: Diagnosis not present

## 2020-03-18 DIAGNOSIS — R202 Paresthesia of skin: Secondary | ICD-10-CM | POA: Insufficient documentation

## 2020-03-18 LAB — COMPREHENSIVE METABOLIC PANEL
ALT: 11 U/L (ref 0–44)
AST: 17 U/L (ref 15–41)
Albumin: 4.1 g/dL (ref 3.5–5.0)
Alkaline Phosphatase: 52 U/L (ref 38–126)
Anion gap: 6 (ref 5–15)
BUN: 22 mg/dL (ref 8–23)
CO2: 31 mmol/L (ref 22–32)
Calcium: 8.9 mg/dL (ref 8.9–10.3)
Chloride: 101 mmol/L (ref 98–111)
Creatinine, Ser: 1.63 mg/dL — ABNORMAL HIGH (ref 0.61–1.24)
GFR calc Af Amer: 44 mL/min — ABNORMAL LOW (ref >60)
GFR calc non Af Amer: 38 mL/min — ABNORMAL LOW (ref >60)
Glucose, Bld: 101 mg/dL — ABNORMAL HIGH (ref 70–99)
Potassium: 4.4 mmol/L (ref 3.5–5.1)
Sodium: 138 mmol/L (ref 135–145)
Total Bilirubin: 0.8 mg/dL (ref 0.3–1.2)
Total Protein: 6.7 g/dL (ref 6.5–8.1)

## 2020-03-18 LAB — CBC
HCT: 41.2 % (ref 39.0–52.0)
Hemoglobin: 13.9 g/dL (ref 13.0–17.0)
MCH: 30.5 pg (ref 26.0–34.0)
MCHC: 33.7 g/dL (ref 30.0–36.0)
MCV: 90.4 fL (ref 80.0–100.0)
Platelets: 176 10*3/uL (ref 150–400)
RBC: 4.56 MIL/uL (ref 4.22–5.81)
RDW: 14 % (ref 11.5–15.5)
WBC: 6 10*3/uL (ref 4.0–10.5)
nRBC: 0 % (ref 0.0–0.2)

## 2020-03-18 LAB — TROPONIN I (HIGH SENSITIVITY): Troponin I (High Sensitivity): 4 ng/L (ref <18)

## 2020-03-18 NOTE — ED Triage Notes (Signed)
Pt states the last couple days he has been feeling more weak than normal- pt states sometimes his hands or feet get tingling- pt denies fever

## 2020-05-14 DIAGNOSIS — L821 Other seborrheic keratosis: Secondary | ICD-10-CM | POA: Diagnosis not present

## 2020-05-14 DIAGNOSIS — Z85820 Personal history of malignant melanoma of skin: Secondary | ICD-10-CM | POA: Diagnosis not present

## 2020-05-14 DIAGNOSIS — C44519 Basal cell carcinoma of skin of other part of trunk: Secondary | ICD-10-CM | POA: Diagnosis not present

## 2020-05-14 DIAGNOSIS — Z08 Encounter for follow-up examination after completed treatment for malignant neoplasm: Secondary | ICD-10-CM | POA: Diagnosis not present

## 2020-05-14 DIAGNOSIS — D485 Neoplasm of uncertain behavior of skin: Secondary | ICD-10-CM | POA: Diagnosis not present

## 2020-05-14 DIAGNOSIS — C4441 Basal cell carcinoma of skin of scalp and neck: Secondary | ICD-10-CM | POA: Diagnosis not present

## 2020-05-14 DIAGNOSIS — C44319 Basal cell carcinoma of skin of other parts of face: Secondary | ICD-10-CM | POA: Diagnosis not present

## 2020-05-14 DIAGNOSIS — Z85828 Personal history of other malignant neoplasm of skin: Secondary | ICD-10-CM | POA: Diagnosis not present

## 2020-06-04 DIAGNOSIS — H401132 Primary open-angle glaucoma, bilateral, moderate stage: Secondary | ICD-10-CM | POA: Diagnosis not present

## 2020-06-10 DIAGNOSIS — C44319 Basal cell carcinoma of skin of other parts of face: Secondary | ICD-10-CM | POA: Diagnosis not present

## 2020-06-16 DIAGNOSIS — C4441 Basal cell carcinoma of skin of scalp and neck: Secondary | ICD-10-CM | POA: Diagnosis not present

## 2020-06-16 DIAGNOSIS — C44519 Basal cell carcinoma of skin of other part of trunk: Secondary | ICD-10-CM | POA: Diagnosis not present

## 2020-08-13 DIAGNOSIS — H401132 Primary open-angle glaucoma, bilateral, moderate stage: Secondary | ICD-10-CM | POA: Diagnosis not present

## 2020-09-03 DIAGNOSIS — H401132 Primary open-angle glaucoma, bilateral, moderate stage: Secondary | ICD-10-CM | POA: Diagnosis not present

## 2020-09-08 DIAGNOSIS — D225 Melanocytic nevi of trunk: Secondary | ICD-10-CM | POA: Diagnosis not present

## 2020-09-08 DIAGNOSIS — C44519 Basal cell carcinoma of skin of other part of trunk: Secondary | ICD-10-CM | POA: Diagnosis not present

## 2020-09-08 DIAGNOSIS — Z85820 Personal history of malignant melanoma of skin: Secondary | ICD-10-CM | POA: Diagnosis not present

## 2020-09-08 DIAGNOSIS — D2272 Melanocytic nevi of left lower limb, including hip: Secondary | ICD-10-CM | POA: Diagnosis not present

## 2020-09-08 DIAGNOSIS — D2262 Melanocytic nevi of left upper limb, including shoulder: Secondary | ICD-10-CM | POA: Diagnosis not present

## 2020-09-08 DIAGNOSIS — D485 Neoplasm of uncertain behavior of skin: Secondary | ICD-10-CM | POA: Diagnosis not present

## 2020-09-08 DIAGNOSIS — Z85828 Personal history of other malignant neoplasm of skin: Secondary | ICD-10-CM | POA: Diagnosis not present

## 2020-09-17 DIAGNOSIS — H401132 Primary open-angle glaucoma, bilateral, moderate stage: Secondary | ICD-10-CM | POA: Diagnosis not present

## 2020-09-29 DIAGNOSIS — C44519 Basal cell carcinoma of skin of other part of trunk: Secondary | ICD-10-CM | POA: Diagnosis not present

## 2020-12-24 DIAGNOSIS — H401132 Primary open-angle glaucoma, bilateral, moderate stage: Secondary | ICD-10-CM | POA: Diagnosis not present

## 2021-02-03 DIAGNOSIS — H401132 Primary open-angle glaucoma, bilateral, moderate stage: Secondary | ICD-10-CM | POA: Diagnosis not present

## 2021-02-28 DIAGNOSIS — R739 Hyperglycemia, unspecified: Secondary | ICD-10-CM | POA: Diagnosis not present

## 2021-02-28 DIAGNOSIS — N1831 Chronic kidney disease, stage 3a: Secondary | ICD-10-CM | POA: Diagnosis not present

## 2021-02-28 DIAGNOSIS — Z125 Encounter for screening for malignant neoplasm of prostate: Secondary | ICD-10-CM | POA: Diagnosis not present

## 2021-02-28 DIAGNOSIS — E7800 Pure hypercholesterolemia, unspecified: Secondary | ICD-10-CM | POA: Diagnosis not present

## 2021-03-07 DIAGNOSIS — E7800 Pure hypercholesterolemia, unspecified: Secondary | ICD-10-CM | POA: Diagnosis not present

## 2021-03-07 DIAGNOSIS — Z1211 Encounter for screening for malignant neoplasm of colon: Secondary | ICD-10-CM | POA: Diagnosis not present

## 2021-03-07 DIAGNOSIS — C61 Malignant neoplasm of prostate: Secondary | ICD-10-CM | POA: Diagnosis not present

## 2021-03-07 DIAGNOSIS — Z0000 Encounter for general adult medical examination without abnormal findings: Secondary | ICD-10-CM | POA: Diagnosis not present

## 2021-03-07 DIAGNOSIS — N1831 Chronic kidney disease, stage 3a: Secondary | ICD-10-CM | POA: Diagnosis not present

## 2021-03-07 DIAGNOSIS — C4372 Malignant melanoma of left lower limb, including hip: Secondary | ICD-10-CM | POA: Diagnosis not present

## 2021-03-07 DIAGNOSIS — R634 Abnormal weight loss: Secondary | ICD-10-CM | POA: Diagnosis not present

## 2021-03-07 DIAGNOSIS — R739 Hyperglycemia, unspecified: Secondary | ICD-10-CM | POA: Diagnosis not present

## 2021-03-07 DIAGNOSIS — Z125 Encounter for screening for malignant neoplasm of prostate: Secondary | ICD-10-CM | POA: Diagnosis not present

## 2021-03-07 DIAGNOSIS — Z1331 Encounter for screening for depression: Secondary | ICD-10-CM | POA: Diagnosis not present

## 2021-03-18 DIAGNOSIS — H401132 Primary open-angle glaucoma, bilateral, moderate stage: Secondary | ICD-10-CM | POA: Diagnosis not present

## 2021-03-23 DIAGNOSIS — Z85820 Personal history of malignant melanoma of skin: Secondary | ICD-10-CM | POA: Diagnosis not present

## 2021-03-23 DIAGNOSIS — Z85828 Personal history of other malignant neoplasm of skin: Secondary | ICD-10-CM | POA: Diagnosis not present

## 2021-03-23 DIAGNOSIS — L570 Actinic keratosis: Secondary | ICD-10-CM | POA: Diagnosis not present

## 2021-03-23 DIAGNOSIS — C44519 Basal cell carcinoma of skin of other part of trunk: Secondary | ICD-10-CM | POA: Diagnosis not present

## 2021-03-23 DIAGNOSIS — X32XXXA Exposure to sunlight, initial encounter: Secondary | ICD-10-CM | POA: Diagnosis not present

## 2021-03-23 DIAGNOSIS — D485 Neoplasm of uncertain behavior of skin: Secondary | ICD-10-CM | POA: Diagnosis not present

## 2021-03-23 DIAGNOSIS — Z08 Encounter for follow-up examination after completed treatment for malignant neoplasm: Secondary | ICD-10-CM | POA: Diagnosis not present

## 2021-05-05 DIAGNOSIS — L905 Scar conditions and fibrosis of skin: Secondary | ICD-10-CM | POA: Diagnosis not present

## 2021-05-05 DIAGNOSIS — C44519 Basal cell carcinoma of skin of other part of trunk: Secondary | ICD-10-CM | POA: Diagnosis not present

## 2021-06-07 DIAGNOSIS — H401133 Primary open-angle glaucoma, bilateral, severe stage: Secondary | ICD-10-CM | POA: Diagnosis not present

## 2021-09-28 DIAGNOSIS — L820 Inflamed seborrheic keratosis: Secondary | ICD-10-CM | POA: Diagnosis not present

## 2021-09-28 DIAGNOSIS — R208 Other disturbances of skin sensation: Secondary | ICD-10-CM | POA: Diagnosis not present

## 2021-09-28 DIAGNOSIS — D485 Neoplasm of uncertain behavior of skin: Secondary | ICD-10-CM | POA: Diagnosis not present

## 2021-09-28 DIAGNOSIS — Z85820 Personal history of malignant melanoma of skin: Secondary | ICD-10-CM | POA: Diagnosis not present

## 2021-09-28 DIAGNOSIS — Z85828 Personal history of other malignant neoplasm of skin: Secondary | ICD-10-CM | POA: Diagnosis not present

## 2021-09-28 DIAGNOSIS — D225 Melanocytic nevi of trunk: Secondary | ICD-10-CM | POA: Diagnosis not present

## 2021-09-28 DIAGNOSIS — D2262 Melanocytic nevi of left upper limb, including shoulder: Secondary | ICD-10-CM | POA: Diagnosis not present

## 2021-09-28 DIAGNOSIS — L821 Other seborrheic keratosis: Secondary | ICD-10-CM | POA: Diagnosis not present

## 2021-09-28 DIAGNOSIS — D045 Carcinoma in situ of skin of trunk: Secondary | ICD-10-CM | POA: Diagnosis not present

## 2021-10-06 DIAGNOSIS — H35373 Puckering of macula, bilateral: Secondary | ICD-10-CM | POA: Diagnosis not present

## 2021-11-18 DIAGNOSIS — H401133 Primary open-angle glaucoma, bilateral, severe stage: Secondary | ICD-10-CM | POA: Diagnosis not present

## 2021-11-23 DIAGNOSIS — D045 Carcinoma in situ of skin of trunk: Secondary | ICD-10-CM | POA: Diagnosis not present

## 2022-03-02 DIAGNOSIS — E7800 Pure hypercholesterolemia, unspecified: Secondary | ICD-10-CM | POA: Diagnosis not present

## 2022-03-02 DIAGNOSIS — N1831 Chronic kidney disease, stage 3a: Secondary | ICD-10-CM | POA: Diagnosis not present

## 2022-03-02 DIAGNOSIS — Z125 Encounter for screening for malignant neoplasm of prostate: Secondary | ICD-10-CM | POA: Diagnosis not present

## 2022-03-02 DIAGNOSIS — R739 Hyperglycemia, unspecified: Secondary | ICD-10-CM | POA: Diagnosis not present

## 2022-03-02 DIAGNOSIS — R634 Abnormal weight loss: Secondary | ICD-10-CM | POA: Diagnosis not present

## 2022-03-16 DIAGNOSIS — Z0000 Encounter for general adult medical examination without abnormal findings: Secondary | ICD-10-CM | POA: Diagnosis not present

## 2022-03-16 DIAGNOSIS — Z1331 Encounter for screening for depression: Secondary | ICD-10-CM | POA: Diagnosis not present

## 2022-03-20 DIAGNOSIS — R079 Chest pain, unspecified: Secondary | ICD-10-CM | POA: Diagnosis not present

## 2022-03-20 DIAGNOSIS — R0789 Other chest pain: Secondary | ICD-10-CM | POA: Diagnosis not present

## 2022-03-31 DIAGNOSIS — H35373 Puckering of macula, bilateral: Secondary | ICD-10-CM | POA: Diagnosis not present

## 2022-04-19 DIAGNOSIS — D2272 Melanocytic nevi of left lower limb, including hip: Secondary | ICD-10-CM | POA: Diagnosis not present

## 2022-04-19 DIAGNOSIS — D2271 Melanocytic nevi of right lower limb, including hip: Secondary | ICD-10-CM | POA: Diagnosis not present

## 2022-04-19 DIAGNOSIS — C44719 Basal cell carcinoma of skin of left lower limb, including hip: Secondary | ICD-10-CM | POA: Diagnosis not present

## 2022-04-19 DIAGNOSIS — C44519 Basal cell carcinoma of skin of other part of trunk: Secondary | ICD-10-CM | POA: Diagnosis not present

## 2022-04-19 DIAGNOSIS — D2261 Melanocytic nevi of right upper limb, including shoulder: Secondary | ICD-10-CM | POA: Diagnosis not present

## 2022-04-19 DIAGNOSIS — X32XXXA Exposure to sunlight, initial encounter: Secondary | ICD-10-CM | POA: Diagnosis not present

## 2022-04-19 DIAGNOSIS — L570 Actinic keratosis: Secondary | ICD-10-CM | POA: Diagnosis not present

## 2022-04-19 DIAGNOSIS — Z85820 Personal history of malignant melanoma of skin: Secondary | ICD-10-CM | POA: Diagnosis not present

## 2022-04-19 DIAGNOSIS — D485 Neoplasm of uncertain behavior of skin: Secondary | ICD-10-CM | POA: Diagnosis not present

## 2022-04-20 DIAGNOSIS — N1831 Chronic kidney disease, stage 3a: Secondary | ICD-10-CM | POA: Diagnosis not present

## 2022-04-20 DIAGNOSIS — I208 Other forms of angina pectoris: Secondary | ICD-10-CM | POA: Diagnosis not present

## 2022-04-20 DIAGNOSIS — R0602 Shortness of breath: Secondary | ICD-10-CM | POA: Diagnosis not present

## 2022-04-20 DIAGNOSIS — E782 Mixed hyperlipidemia: Secondary | ICD-10-CM | POA: Diagnosis not present

## 2022-04-24 ENCOUNTER — Other Ambulatory Visit: Payer: Self-pay | Admitting: Internal Medicine

## 2022-04-24 DIAGNOSIS — C44719 Basal cell carcinoma of skin of left lower limb, including hip: Secondary | ICD-10-CM | POA: Diagnosis not present

## 2022-04-24 DIAGNOSIS — I2089 Other forms of angina pectoris: Secondary | ICD-10-CM

## 2022-04-24 DIAGNOSIS — R0602 Shortness of breath: Secondary | ICD-10-CM

## 2022-04-28 ENCOUNTER — Telehealth (HOSPITAL_COMMUNITY): Payer: Self-pay | Admitting: Emergency Medicine

## 2022-04-28 DIAGNOSIS — R079 Chest pain, unspecified: Secondary | ICD-10-CM

## 2022-04-28 MED ORDER — METOPROLOL TARTRATE 50 MG PO TABS: 50.0000 mg | ORAL_TABLET | Freq: Once | ORAL | 0 refills | Status: DC

## 2022-04-28 NOTE — Telephone Encounter (Signed)
Reaching out to patient to offer assistance regarding upcoming cardiac imaging study; pt verbalizes understanding of appt date/time, parking situation and where to check in, pre-test NPO status and medications ordered, and verified current allergies; name and call back number provided for further questions should they arise Marchia Bond RN Navigator Cardiac Imaging [facility] [phone] office [phone] cell   Arrival [facility] '50mg'$  metoprolol tartrate Denies iv issues

## 2022-05-01 ENCOUNTER — Other Ambulatory Visit: Payer: Self-pay | Admitting: Internal Medicine

## 2022-05-01 ENCOUNTER — Ambulatory Visit
Admission: RE | Admit: 2022-05-01 | Discharge: 2022-05-01 | Disposition: A | Discharge status: Home / Self Care | Payer: PPO | Source: Ambulatory Visit | Attending: Internal Medicine | Admitting: Internal Medicine

## 2022-05-01 ENCOUNTER — Ambulatory Visit
Admission: RE | Admit: 2022-05-01 | Discharge: 2022-05-01 | Disposition: A | Discharge status: Home / Self Care | Payer: PPO | Source: Ambulatory Visit | Attending: Cardiology | Admitting: Cardiology

## 2022-05-01 ENCOUNTER — Other Ambulatory Visit: Payer: Self-pay | Admitting: Family Medicine

## 2022-05-01 DIAGNOSIS — I2089 Other forms of angina pectoris: Secondary | ICD-10-CM

## 2022-05-01 DIAGNOSIS — R0602 Shortness of breath: Secondary | ICD-10-CM | POA: Diagnosis not present

## 2022-05-01 DIAGNOSIS — R931 Abnormal findings on diagnostic imaging of heart and coronary circulation: Secondary | ICD-10-CM | POA: Diagnosis not present

## 2022-05-01 DIAGNOSIS — I25118 Atherosclerotic heart disease of native coronary artery with other forms of angina pectoris: Secondary | ICD-10-CM | POA: Insufficient documentation

## 2022-05-01 LAB — POCT I-STAT CREATININE: Creatinine, Ser: 1.4 mg/dL — ABNORMAL HIGH (ref 0.61–1.24)

## 2022-05-01 MED ORDER — IOHEXOL 350 MG/ML SOLN
100.0000 mL | Freq: Once | INTRAVENOUS | Status: AC | PRN
  Administered 2022-05-01: 100 mL via INTRAVENOUS

## 2022-05-01 MED ORDER — NITROGLYCERIN 0.4 MG SL SUBL
0.8000 mg | SUBLINGUAL_TABLET | Freq: Once | SUBLINGUAL | Status: AC
  Administered 2022-05-01: 0.8 mg via SUBLINGUAL

## 2022-05-01 NOTE — Progress Notes (Signed)
Patient tolerated procedure well. Ambulate w/o difficulty. Denies any lightheadedness or being dizzy. Pt denies any pain at this time. Sitting in chair, pt is encouraged to drink additional water throughout the day and reason explained to patient. Patient verbalized understanding and all questions answered. ABC intact. No further needs at this time. Discharge from procedure area w/o issues.  

## 2022-05-04 DIAGNOSIS — R0602 Shortness of breath: Secondary | ICD-10-CM | POA: Diagnosis not present

## 2022-05-04 DIAGNOSIS — I2089 Other forms of angina pectoris: Secondary | ICD-10-CM | POA: Diagnosis not present

## 2022-05-05 DIAGNOSIS — H401133 Primary open-angle glaucoma, bilateral, severe stage: Secondary | ICD-10-CM | POA: Diagnosis not present

## 2022-05-10 DIAGNOSIS — C44519 Basal cell carcinoma of skin of other part of trunk: Secondary | ICD-10-CM | POA: Diagnosis not present

## 2022-05-22 DIAGNOSIS — E782 Mixed hyperlipidemia: Secondary | ICD-10-CM | POA: Diagnosis not present

## 2022-05-22 DIAGNOSIS — R0602 Shortness of breath: Secondary | ICD-10-CM | POA: Diagnosis not present

## 2022-05-22 DIAGNOSIS — I2089 Other forms of angina pectoris: Secondary | ICD-10-CM | POA: Diagnosis not present

## 2022-05-22 DIAGNOSIS — N1831 Chronic kidney disease, stage 3a: Secondary | ICD-10-CM | POA: Diagnosis not present

## 2022-05-22 DIAGNOSIS — R079 Chest pain, unspecified: Secondary | ICD-10-CM | POA: Diagnosis not present

## 2022-06-20 DIAGNOSIS — H35372 Puckering of macula, left eye: Secondary | ICD-10-CM | POA: Diagnosis not present

## 2022-08-01 DIAGNOSIS — M9905 Segmental and somatic dysfunction of pelvic region: Secondary | ICD-10-CM | POA: Diagnosis not present

## 2022-08-01 DIAGNOSIS — M5417 Radiculopathy, lumbosacral region: Secondary | ICD-10-CM | POA: Diagnosis not present

## 2022-08-01 DIAGNOSIS — M9903 Segmental and somatic dysfunction of lumbar region: Secondary | ICD-10-CM | POA: Diagnosis not present

## 2022-08-01 DIAGNOSIS — M5416 Radiculopathy, lumbar region: Secondary | ICD-10-CM | POA: Diagnosis not present

## 2022-08-02 DIAGNOSIS — M9905 Segmental and somatic dysfunction of pelvic region: Secondary | ICD-10-CM | POA: Diagnosis not present

## 2022-08-02 DIAGNOSIS — M9903 Segmental and somatic dysfunction of lumbar region: Secondary | ICD-10-CM | POA: Diagnosis not present

## 2022-08-02 DIAGNOSIS — M5417 Radiculopathy, lumbosacral region: Secondary | ICD-10-CM | POA: Diagnosis not present

## 2022-08-02 DIAGNOSIS — M5416 Radiculopathy, lumbar region: Secondary | ICD-10-CM | POA: Diagnosis not present

## 2022-08-03 DIAGNOSIS — M9905 Segmental and somatic dysfunction of pelvic region: Secondary | ICD-10-CM | POA: Diagnosis not present

## 2022-08-03 DIAGNOSIS — M9903 Segmental and somatic dysfunction of lumbar region: Secondary | ICD-10-CM | POA: Diagnosis not present

## 2022-08-03 DIAGNOSIS — M5417 Radiculopathy, lumbosacral region: Secondary | ICD-10-CM | POA: Diagnosis not present

## 2022-08-03 DIAGNOSIS — M5416 Radiculopathy, lumbar region: Secondary | ICD-10-CM | POA: Diagnosis not present

## 2022-08-07 DIAGNOSIS — M9903 Segmental and somatic dysfunction of lumbar region: Secondary | ICD-10-CM | POA: Diagnosis not present

## 2022-08-07 DIAGNOSIS — M5416 Radiculopathy, lumbar region: Secondary | ICD-10-CM | POA: Diagnosis not present

## 2022-08-07 DIAGNOSIS — M5417 Radiculopathy, lumbosacral region: Secondary | ICD-10-CM | POA: Diagnosis not present

## 2022-08-07 DIAGNOSIS — M9905 Segmental and somatic dysfunction of pelvic region: Secondary | ICD-10-CM | POA: Diagnosis not present

## 2022-08-09 DIAGNOSIS — M9905 Segmental and somatic dysfunction of pelvic region: Secondary | ICD-10-CM | POA: Diagnosis not present

## 2022-08-09 DIAGNOSIS — M9903 Segmental and somatic dysfunction of lumbar region: Secondary | ICD-10-CM | POA: Diagnosis not present

## 2022-08-09 DIAGNOSIS — M5416 Radiculopathy, lumbar region: Secondary | ICD-10-CM | POA: Diagnosis not present

## 2022-08-09 DIAGNOSIS — M5417 Radiculopathy, lumbosacral region: Secondary | ICD-10-CM | POA: Diagnosis not present

## 2022-08-10 DIAGNOSIS — M5417 Radiculopathy, lumbosacral region: Secondary | ICD-10-CM | POA: Diagnosis not present

## 2022-08-10 DIAGNOSIS — M9905 Segmental and somatic dysfunction of pelvic region: Secondary | ICD-10-CM | POA: Diagnosis not present

## 2022-08-10 DIAGNOSIS — M9903 Segmental and somatic dysfunction of lumbar region: Secondary | ICD-10-CM | POA: Diagnosis not present

## 2022-08-10 DIAGNOSIS — M5416 Radiculopathy, lumbar region: Secondary | ICD-10-CM | POA: Diagnosis not present

## 2022-08-14 DIAGNOSIS — M9903 Segmental and somatic dysfunction of lumbar region: Secondary | ICD-10-CM | POA: Diagnosis not present

## 2022-08-14 DIAGNOSIS — M5416 Radiculopathy, lumbar region: Secondary | ICD-10-CM | POA: Diagnosis not present

## 2022-08-14 DIAGNOSIS — M9905 Segmental and somatic dysfunction of pelvic region: Secondary | ICD-10-CM | POA: Diagnosis not present

## 2022-08-14 DIAGNOSIS — M5417 Radiculopathy, lumbosacral region: Secondary | ICD-10-CM | POA: Diagnosis not present

## 2022-08-16 DIAGNOSIS — M9905 Segmental and somatic dysfunction of pelvic region: Secondary | ICD-10-CM | POA: Diagnosis not present

## 2022-08-16 DIAGNOSIS — M9903 Segmental and somatic dysfunction of lumbar region: Secondary | ICD-10-CM | POA: Diagnosis not present

## 2022-08-16 DIAGNOSIS — M5416 Radiculopathy, lumbar region: Secondary | ICD-10-CM | POA: Diagnosis not present

## 2022-08-16 DIAGNOSIS — M5417 Radiculopathy, lumbosacral region: Secondary | ICD-10-CM | POA: Diagnosis not present

## 2022-08-17 DIAGNOSIS — M9903 Segmental and somatic dysfunction of lumbar region: Secondary | ICD-10-CM | POA: Diagnosis not present

## 2022-08-17 DIAGNOSIS — M9905 Segmental and somatic dysfunction of pelvic region: Secondary | ICD-10-CM | POA: Diagnosis not present

## 2022-08-17 DIAGNOSIS — M5417 Radiculopathy, lumbosacral region: Secondary | ICD-10-CM | POA: Diagnosis not present

## 2022-08-17 DIAGNOSIS — M5416 Radiculopathy, lumbar region: Secondary | ICD-10-CM | POA: Diagnosis not present

## 2022-08-22 DIAGNOSIS — M5417 Radiculopathy, lumbosacral region: Secondary | ICD-10-CM | POA: Diagnosis not present

## 2022-08-22 DIAGNOSIS — M5416 Radiculopathy, lumbar region: Secondary | ICD-10-CM | POA: Diagnosis not present

## 2022-08-22 DIAGNOSIS — M9903 Segmental and somatic dysfunction of lumbar region: Secondary | ICD-10-CM | POA: Diagnosis not present

## 2022-08-22 DIAGNOSIS — M9905 Segmental and somatic dysfunction of pelvic region: Secondary | ICD-10-CM | POA: Diagnosis not present

## 2022-08-24 DIAGNOSIS — M5416 Radiculopathy, lumbar region: Secondary | ICD-10-CM | POA: Diagnosis not present

## 2022-08-24 DIAGNOSIS — M9903 Segmental and somatic dysfunction of lumbar region: Secondary | ICD-10-CM | POA: Diagnosis not present

## 2022-08-24 DIAGNOSIS — M5417 Radiculopathy, lumbosacral region: Secondary | ICD-10-CM | POA: Diagnosis not present

## 2022-08-24 DIAGNOSIS — M9905 Segmental and somatic dysfunction of pelvic region: Secondary | ICD-10-CM | POA: Diagnosis not present

## 2022-08-29 DIAGNOSIS — M9903 Segmental and somatic dysfunction of lumbar region: Secondary | ICD-10-CM | POA: Diagnosis not present

## 2022-08-29 DIAGNOSIS — M5417 Radiculopathy, lumbosacral region: Secondary | ICD-10-CM | POA: Diagnosis not present

## 2022-08-29 DIAGNOSIS — H401133 Primary open-angle glaucoma, bilateral, severe stage: Secondary | ICD-10-CM | POA: Diagnosis not present

## 2022-08-29 DIAGNOSIS — H35372 Puckering of macula, left eye: Secondary | ICD-10-CM | POA: Diagnosis not present

## 2022-08-29 DIAGNOSIS — M9905 Segmental and somatic dysfunction of pelvic region: Secondary | ICD-10-CM | POA: Diagnosis not present

## 2022-08-29 DIAGNOSIS — M5416 Radiculopathy, lumbar region: Secondary | ICD-10-CM | POA: Diagnosis not present

## 2022-08-31 DIAGNOSIS — M9905 Segmental and somatic dysfunction of pelvic region: Secondary | ICD-10-CM | POA: Diagnosis not present

## 2022-08-31 DIAGNOSIS — M5417 Radiculopathy, lumbosacral region: Secondary | ICD-10-CM | POA: Diagnosis not present

## 2022-08-31 DIAGNOSIS — M9903 Segmental and somatic dysfunction of lumbar region: Secondary | ICD-10-CM | POA: Diagnosis not present

## 2022-08-31 DIAGNOSIS — M5416 Radiculopathy, lumbar region: Secondary | ICD-10-CM | POA: Diagnosis not present

## 2022-09-05 DIAGNOSIS — M5416 Radiculopathy, lumbar region: Secondary | ICD-10-CM | POA: Diagnosis not present

## 2022-09-05 DIAGNOSIS — M9903 Segmental and somatic dysfunction of lumbar region: Secondary | ICD-10-CM | POA: Diagnosis not present

## 2022-09-05 DIAGNOSIS — M9905 Segmental and somatic dysfunction of pelvic region: Secondary | ICD-10-CM | POA: Diagnosis not present

## 2022-09-05 DIAGNOSIS — M5417 Radiculopathy, lumbosacral region: Secondary | ICD-10-CM | POA: Diagnosis not present

## 2022-09-07 DIAGNOSIS — M5416 Radiculopathy, lumbar region: Secondary | ICD-10-CM | POA: Diagnosis not present

## 2022-09-07 DIAGNOSIS — M9903 Segmental and somatic dysfunction of lumbar region: Secondary | ICD-10-CM | POA: Diagnosis not present

## 2022-09-07 DIAGNOSIS — M5417 Radiculopathy, lumbosacral region: Secondary | ICD-10-CM | POA: Diagnosis not present

## 2022-09-07 DIAGNOSIS — M9905 Segmental and somatic dysfunction of pelvic region: Secondary | ICD-10-CM | POA: Diagnosis not present

## 2022-09-12 DIAGNOSIS — R739 Hyperglycemia, unspecified: Secondary | ICD-10-CM | POA: Diagnosis not present

## 2022-09-12 DIAGNOSIS — N1831 Chronic kidney disease, stage 3a: Secondary | ICD-10-CM | POA: Diagnosis not present

## 2022-09-12 DIAGNOSIS — E7800 Pure hypercholesterolemia, unspecified: Secondary | ICD-10-CM | POA: Diagnosis not present

## 2022-09-13 DIAGNOSIS — M5417 Radiculopathy, lumbosacral region: Secondary | ICD-10-CM | POA: Diagnosis not present

## 2022-09-13 DIAGNOSIS — M9903 Segmental and somatic dysfunction of lumbar region: Secondary | ICD-10-CM | POA: Diagnosis not present

## 2022-09-13 DIAGNOSIS — M5416 Radiculopathy, lumbar region: Secondary | ICD-10-CM | POA: Diagnosis not present

## 2022-09-13 DIAGNOSIS — M9905 Segmental and somatic dysfunction of pelvic region: Secondary | ICD-10-CM | POA: Diagnosis not present

## 2022-09-18 DIAGNOSIS — E7800 Pure hypercholesterolemia, unspecified: Secondary | ICD-10-CM | POA: Diagnosis not present

## 2022-09-18 DIAGNOSIS — R7303 Prediabetes: Secondary | ICD-10-CM | POA: Diagnosis not present

## 2022-09-18 DIAGNOSIS — C61 Malignant neoplasm of prostate: Secondary | ICD-10-CM | POA: Diagnosis not present

## 2022-09-18 DIAGNOSIS — D649 Anemia, unspecified: Secondary | ICD-10-CM | POA: Diagnosis not present

## 2022-09-18 DIAGNOSIS — Z79899 Other long term (current) drug therapy: Secondary | ICD-10-CM | POA: Diagnosis not present

## 2022-09-18 DIAGNOSIS — Z125 Encounter for screening for malignant neoplasm of prostate: Secondary | ICD-10-CM | POA: Diagnosis not present

## 2022-09-18 DIAGNOSIS — N1831 Chronic kidney disease, stage 3a: Secondary | ICD-10-CM | POA: Diagnosis not present

## 2022-09-18 DIAGNOSIS — C4372 Malignant melanoma of left lower limb, including hip: Secondary | ICD-10-CM | POA: Diagnosis not present

## 2022-09-20 DIAGNOSIS — M9903 Segmental and somatic dysfunction of lumbar region: Secondary | ICD-10-CM | POA: Diagnosis not present

## 2022-09-20 DIAGNOSIS — M5416 Radiculopathy, lumbar region: Secondary | ICD-10-CM | POA: Diagnosis not present

## 2022-09-20 DIAGNOSIS — M9905 Segmental and somatic dysfunction of pelvic region: Secondary | ICD-10-CM | POA: Diagnosis not present

## 2022-09-20 DIAGNOSIS — M5417 Radiculopathy, lumbosacral region: Secondary | ICD-10-CM | POA: Diagnosis not present

## 2022-11-01 DIAGNOSIS — D2261 Melanocytic nevi of right upper limb, including shoulder: Secondary | ICD-10-CM | POA: Diagnosis not present

## 2022-11-01 DIAGNOSIS — D485 Neoplasm of uncertain behavior of skin: Secondary | ICD-10-CM | POA: Diagnosis not present

## 2022-11-01 DIAGNOSIS — D2272 Melanocytic nevi of left lower limb, including hip: Secondary | ICD-10-CM | POA: Diagnosis not present

## 2022-11-01 DIAGNOSIS — D2262 Melanocytic nevi of left upper limb, including shoulder: Secondary | ICD-10-CM | POA: Diagnosis not present

## 2022-11-01 DIAGNOSIS — Z85820 Personal history of malignant melanoma of skin: Secondary | ICD-10-CM | POA: Diagnosis not present

## 2022-11-01 DIAGNOSIS — Z85828 Personal history of other malignant neoplasm of skin: Secondary | ICD-10-CM | POA: Diagnosis not present

## 2022-11-01 DIAGNOSIS — L820 Inflamed seborrheic keratosis: Secondary | ICD-10-CM | POA: Diagnosis not present

## 2022-11-03 DIAGNOSIS — H401133 Primary open-angle glaucoma, bilateral, severe stage: Secondary | ICD-10-CM | POA: Diagnosis not present

## 2022-11-17 DIAGNOSIS — I350 Nonrheumatic aortic (valve) stenosis: Secondary | ICD-10-CM | POA: Diagnosis not present

## 2022-11-17 DIAGNOSIS — I2510 Atherosclerotic heart disease of native coronary artery without angina pectoris: Secondary | ICD-10-CM | POA: Diagnosis not present

## 2022-11-17 DIAGNOSIS — E782 Mixed hyperlipidemia: Secondary | ICD-10-CM | POA: Diagnosis not present

## 2022-11-17 DIAGNOSIS — N1831 Chronic kidney disease, stage 3a: Secondary | ICD-10-CM | POA: Diagnosis not present

## 2022-12-15 DIAGNOSIS — H33002 Unspecified retinal detachment with retinal break, left eye: Secondary | ICD-10-CM | POA: Diagnosis not present

## 2022-12-15 DIAGNOSIS — H401133 Primary open-angle glaucoma, bilateral, severe stage: Secondary | ICD-10-CM | POA: Diagnosis not present

## 2022-12-15 DIAGNOSIS — Z961 Presence of intraocular lens: Secondary | ICD-10-CM | POA: Diagnosis not present

## 2022-12-15 DIAGNOSIS — H35373 Puckering of macula, bilateral: Secondary | ICD-10-CM | POA: Diagnosis not present

## 2023-03-12 DIAGNOSIS — R7303 Prediabetes: Secondary | ICD-10-CM | POA: Diagnosis not present

## 2023-03-12 DIAGNOSIS — M5431 Sciatica, right side: Secondary | ICD-10-CM | POA: Diagnosis not present

## 2023-03-12 DIAGNOSIS — M5136 Other intervertebral disc degeneration, lumbar region: Secondary | ICD-10-CM | POA: Diagnosis not present

## 2023-03-12 DIAGNOSIS — E7800 Pure hypercholesterolemia, unspecified: Secondary | ICD-10-CM | POA: Diagnosis not present

## 2023-03-12 DIAGNOSIS — Z125 Encounter for screening for malignant neoplasm of prostate: Secondary | ICD-10-CM | POA: Diagnosis not present

## 2023-03-12 DIAGNOSIS — M5441 Lumbago with sciatica, right side: Secondary | ICD-10-CM | POA: Diagnosis not present

## 2023-03-12 DIAGNOSIS — N1831 Chronic kidney disease, stage 3a: Secondary | ICD-10-CM | POA: Diagnosis not present

## 2023-03-19 DIAGNOSIS — E7800 Pure hypercholesterolemia, unspecified: Secondary | ICD-10-CM | POA: Diagnosis not present

## 2023-03-19 DIAGNOSIS — Z79899 Other long term (current) drug therapy: Secondary | ICD-10-CM | POA: Diagnosis not present

## 2023-03-19 DIAGNOSIS — R7303 Prediabetes: Secondary | ICD-10-CM | POA: Diagnosis not present

## 2023-03-19 DIAGNOSIS — N1831 Chronic kidney disease, stage 3a: Secondary | ICD-10-CM | POA: Diagnosis not present

## 2023-03-19 DIAGNOSIS — Z1331 Encounter for screening for depression: Secondary | ICD-10-CM | POA: Diagnosis not present

## 2023-03-19 DIAGNOSIS — Z0000 Encounter for general adult medical examination without abnormal findings: Secondary | ICD-10-CM | POA: Diagnosis not present

## 2023-03-19 DIAGNOSIS — D649 Anemia, unspecified: Secondary | ICD-10-CM | POA: Diagnosis not present

## 2023-03-19 DIAGNOSIS — C61 Malignant neoplasm of prostate: Secondary | ICD-10-CM | POA: Diagnosis not present

## 2023-03-27 DIAGNOSIS — M5431 Sciatica, right side: Secondary | ICD-10-CM | POA: Diagnosis not present

## 2023-03-27 DIAGNOSIS — M5441 Lumbago with sciatica, right side: Secondary | ICD-10-CM | POA: Diagnosis not present

## 2023-03-27 DIAGNOSIS — M5136 Other intervertebral disc degeneration, lumbar region: Secondary | ICD-10-CM | POA: Diagnosis not present

## 2023-03-29 ENCOUNTER — Emergency Department: Payer: PPO

## 2023-03-29 ENCOUNTER — Other Ambulatory Visit: Payer: Self-pay

## 2023-03-29 ENCOUNTER — Emergency Department
Admission: EM | Admit: 2023-03-29 | Discharge: 2023-03-29 | Disposition: A | Discharge status: Home / Self Care | Payer: PPO | Attending: Emergency Medicine | Admitting: Emergency Medicine

## 2023-03-29 ENCOUNTER — Encounter: Payer: Self-pay | Admitting: Emergency Medicine

## 2023-03-29 DIAGNOSIS — Z7982 Long term (current) use of aspirin: Secondary | ICD-10-CM | POA: Insufficient documentation

## 2023-03-29 DIAGNOSIS — R930 Abnormal findings on diagnostic imaging of skull and head, not elsewhere classified: Secondary | ICD-10-CM | POA: Insufficient documentation

## 2023-03-29 DIAGNOSIS — Z23 Encounter for immunization: Secondary | ICD-10-CM | POA: Diagnosis not present

## 2023-03-29 DIAGNOSIS — S61212A Laceration without foreign body of right middle finger without damage to nail, initial encounter: Secondary | ICD-10-CM | POA: Diagnosis not present

## 2023-03-29 DIAGNOSIS — S63259A Unspecified dislocation of unspecified finger, initial encounter: Secondary | ICD-10-CM | POA: Diagnosis not present

## 2023-03-29 DIAGNOSIS — S63250A Unspecified dislocation of right index finger, initial encounter: Secondary | ICD-10-CM | POA: Insufficient documentation

## 2023-03-29 DIAGNOSIS — S0990XA Unspecified injury of head, initial encounter: Secondary | ICD-10-CM | POA: Insufficient documentation

## 2023-03-29 DIAGNOSIS — Y9301 Activity, walking, marching and hiking: Secondary | ICD-10-CM | POA: Diagnosis not present

## 2023-03-29 DIAGNOSIS — M7989 Other specified soft tissue disorders: Secondary | ICD-10-CM | POA: Diagnosis not present

## 2023-03-29 DIAGNOSIS — M79644 Pain in right finger(s): Secondary | ICD-10-CM | POA: Diagnosis not present

## 2023-03-29 DIAGNOSIS — S63287A Dislocation of proximal interphalangeal joint of left little finger, initial encounter: Secondary | ICD-10-CM | POA: Diagnosis not present

## 2023-03-29 DIAGNOSIS — W1781XA Fall down embankment (hill), initial encounter: Secondary | ICD-10-CM | POA: Insufficient documentation

## 2023-03-29 DIAGNOSIS — R519 Headache, unspecified: Secondary | ICD-10-CM | POA: Diagnosis not present

## 2023-03-29 DIAGNOSIS — M19031 Primary osteoarthritis, right wrist: Secondary | ICD-10-CM | POA: Diagnosis not present

## 2023-03-29 DIAGNOSIS — S6991XA Unspecified injury of right wrist, hand and finger(s), initial encounter: Secondary | ICD-10-CM | POA: Diagnosis present

## 2023-03-29 DIAGNOSIS — M19041 Primary osteoarthritis, right hand: Secondary | ICD-10-CM | POA: Diagnosis not present

## 2023-03-29 DIAGNOSIS — S06340A Traumatic hemorrhage of right cerebrum without loss of consciousness, initial encounter: Secondary | ICD-10-CM | POA: Diagnosis not present

## 2023-03-29 MED ORDER — LIDOCAINE HCL (PF) 1 % IJ SOLN
5.0000 mL | Freq: Once | INTRAMUSCULAR | Status: AC
  Administered 2023-03-29: 5 mL
  Filled 2023-03-29: qty 5

## 2023-03-29 MED ORDER — CEPHALEXIN 500 MG PO CAPS: 500.0000 mg | ORAL_CAPSULE | Freq: Four times a day (QID) | ORAL | 0 refills | Status: AC

## 2023-03-29 MED ORDER — TETANUS-DIPHTH-ACELL PERTUSSIS 5-2.5-18.5 LF-MCG/0.5 IM SUSY
0.5000 mL | PREFILLED_SYRINGE | Freq: Once | INTRAMUSCULAR | Status: AC
  Administered 2023-03-29: 0.5 mL via INTRAMUSCULAR
  Filled 2023-03-29: qty 0.5

## 2023-03-29 NOTE — ED Provider Notes (Signed)
[facility] Provider Note    Event Date/Time   First MD Initiated Contact with Patient 03/29/23 1306     (approximate)   History   Fall   HPI  James West is a 89 year old male presenting to the emergency department for evaluation after a fall.  Patient was walking down a sloped hill while weed eating when he lost his balance and fell headfirst into the asphalt.  No loss of consciousness.  Takes a baby aspirin, no other blood thinners.  Noted to have an abrasion over his left hand.  Also noted that he landed on his hand and thinks he dislocated his middle finger but was able to relocated and is concerned that his index anger is still dislocated.  Does have a laceration over his third digit.  Denies numbness, tingling, focal weakness.     Physical Exam   Triage Vital Signs: ED Triage Vitals  Encounter Vitals Group     BP 03/29/23 1222 (!) 184/90     Systolic BP Percentile --      Diastolic BP Percentile --      Pulse Rate 03/29/23 1222 65     Resp 03/29/23 1222 18     Temp 03/29/23 1222 97.8 F (36.6 C)     Temp Source 03/29/23 1222 Oral     SpO2 03/29/23 1222 97 %     Weight 03/29/23 1221 160 lb (72.6 kg)     Height 03/29/23 1221 5\' 11"  (1.803 m)     Head Circumference --      Peak Flow --      Pain Score 03/29/23 1221 2     Pain Loc --      Pain Education --      Exclude from Growth Chart --     Most recent vital signs: Vitals:   03/29/23 1222 03/29/23 1532  BP: (!) 184/90 (!) 181/91  Pulse: 65 60  Resp: 18 16  Temp: 97.8 F (36.6 C)   SpO2: 97% 97%    Nursing notes and vital signs reviewed.  General: Adult male, laying in bed, awake and interactive Head: Superficial abrasion over the scalp Chest: Symmetric chest rise, no tenderness to palpation.  Cardiac: Regular rhythm and rate.  Respiratory: Lungs clear to auscultation Abdomen: Soft, nondistended. No tenderness to palpation.  Pelvis: Stable in AP and lateral compression. No  tenderness to palpation. MSK: There is deformity over the index finger.  There is a laceration along the volar aspect of the third digit with visible tendon that appears intact.  Otherwise, no deformity to bilateral upper and lower extremity. Full range of motion to bilateral upper lower extremity with no pain. Neuro: Alert, oriented. GCS 15. 5 out of 5 strength in bilateral upper and lower extremities. Normal sensation to light touch in bilateral upper and lower extremity. Skin: No evidence of burns or lacerations.  ED Results / Procedures / Treatments   Labs (all labs ordered are listed, but only abnormal results are displayed) Labs Reviewed - No data to display   EKG EKG independently reviewed interpreted by myself (ER attending) demonstrates:    RADIOLOGY Imaging independently reviewed and interpreted by myself demonstrates:  CT head with area of hyperdensity possibly representing hemorrhage Middle finger x-Aysen Shieh demonstrating third digit with possible avulsion fracture, also noted dislocation of PIP of second digit, close reduction performed with repeat x-Jahad Old demonstrating relocation  PROCEDURES:  Critical Care performed: No  Reduction of dislocation  Date/Time: 03/29/2023 4:45  PM  Performed by: Trinna Post, MD Authorized by: Trinna Post, MD  Consent: Verbal consent obtained. Risks and benefits: risks, benefits and alternatives were discussed Local anesthesia used: no  Anesthesia: Local anesthesia used: no  Sedation: Patient sedated: no  Comments: Closed reduction of the right second PIP joint      MEDICATIONS ORDERED IN ED: Medications  lidocaine (PF) (XYLOCAINE) 1 % injection 5 mL (has no administration in time range)  Tdap (BOOSTRIX) injection 0.5 mL (has no administration in time range)     IMPRESSION / MDM / ASSESSMENT AND PLAN / ED COURSE  I reviewed the triage vital signs and the nursing notes.  Differential diagnosis includes, but is not limited to, finger  dislocation, fracture, intracranial bleed, skull fracture, no evidence of spine or thoracoabdominal trauma  Patient's presentation is most consistent with acute presentation with potential threat to life or bodily function.  89 year old male presenting after a fall.  CT head with area of a possible bleed.  Reviewed with Dr. Katrinka Blazing of neurosurgery.  He recommended obtaining an MRI for further evaluation which is ordered.  Finger x-Laycie Schriner did demonstrate a avulsion fracture of the third digit as well as possible fracture of the second digit that was able to be reduced.  Will have patient placed in finger brace and send antibiotics given underlying avulsion fracture over laceration.  Signed out to oncoming provider pending MRI read, discussion with neurosurgery as appropriate, and disposition.      FINAL CLINICAL IMPRESSION(S) / ED DIAGNOSES   Final diagnoses:  Injury of head, initial encounter  Dislocation of finger, initial encounter  Laceration of right middle finger without foreign body without damage to nail, initial encounter     Rx / DC Orders   ED Discharge Orders          Ordered    cephALEXin (KEFLEX) 500 MG capsule  4 times daily        03/29/23 1647             Note:  This document was prepared using Dragon voice recognition software and may include unintentional dictation errors.   Trinna Post, MD 03/29/23 [phone]

## 2023-03-29 NOTE — ED Triage Notes (Signed)
Patient arrives ambulatory by POV with daughter- states he lost his balance while weed eating. Patient states he went head first into the asphalt. No LOC. No blood thinners, takes baby aspirin. Patient has abrasion to left upper head- bleeding controlled. Patient also c/o pain to left pointer finger. States he was able to pop his middle digit back into place.

## 2023-03-29 NOTE — ED Notes (Signed)
Provider at bedside

## 2023-03-30 ENCOUNTER — Telehealth: Payer: Self-pay | Admitting: Neurosurgery

## 2023-03-30 ENCOUNTER — Encounter: Payer: Self-pay | Admitting: Neurosurgery

## 2023-03-30 DIAGNOSIS — D496 Neoplasm of unspecified behavior of brain: Secondary | ICD-10-CM

## 2023-03-30 NOTE — Telephone Encounter (Signed)
Per [facility] consult for cranial mass. Needs follow up on 3 months with MRI brain with and without. Can be with me or Dr. Katrinka Blazing

## 2023-03-30 NOTE — Telephone Encounter (Signed)
MRI has been ordered with diagnosis intracranial tumor (per Duwayne Heck) to be done around 06/29/23. Per discussion with Duwayne Heck, he will need a new patient appointment after the MRI with Danielle or Dr Katrinka Blazing.  Patty, please contact him to schedule the 3 month appointment and please make sure he is aware that he should have the MRI about 2 weeks prior to the appointment with [place] so we have the results in time (the radiology reports have been taking a little longer than usual lately). He can call to schedule this, or he can wait for them to call him ([facility] [employer] doesn't require prior approval for MRI's). Thanks!

## 2023-04-02 NOTE — Group Note (Deleted)

## 2023-04-05 NOTE — Telephone Encounter (Signed)
Patient is aware to schedule Mri around 06/29/23 and then f/u with Dr.Smith.

## 2023-04-06 NOTE — Telephone Encounter (Signed)
Left message to call back  

## 2023-04-10 DIAGNOSIS — M25541 Pain in joints of right hand: Secondary | ICD-10-CM | POA: Diagnosis not present

## 2023-04-10 DIAGNOSIS — S0990XD Unspecified injury of head, subsequent encounter: Secondary | ICD-10-CM | POA: Diagnosis not present

## 2023-04-10 DIAGNOSIS — S61212D Laceration without foreign body of right middle finger without damage to nail, subsequent encounter: Secondary | ICD-10-CM | POA: Diagnosis not present

## 2023-04-11 NOTE — Telephone Encounter (Signed)
MRI 06/29/2023 Dr.Smith 07/16/2023

## 2023-04-30 DIAGNOSIS — I350 Nonrheumatic aortic (valve) stenosis: Secondary | ICD-10-CM | POA: Diagnosis not present

## 2023-05-07 DIAGNOSIS — M5441 Lumbago with sciatica, right side: Secondary | ICD-10-CM | POA: Diagnosis not present

## 2023-05-07 DIAGNOSIS — M51362 Other intervertebral disc degeneration, lumbar region with discogenic back pain and lower extremity pain: Secondary | ICD-10-CM | POA: Diagnosis not present

## 2023-05-09 DIAGNOSIS — D2271 Melanocytic nevi of right lower limb, including hip: Secondary | ICD-10-CM | POA: Diagnosis not present

## 2023-05-09 DIAGNOSIS — Z85820 Personal history of malignant melanoma of skin: Secondary | ICD-10-CM | POA: Diagnosis not present

## 2023-05-09 DIAGNOSIS — D2262 Melanocytic nevi of left upper limb, including shoulder: Secondary | ICD-10-CM | POA: Diagnosis not present

## 2023-05-09 DIAGNOSIS — L820 Inflamed seborrheic keratosis: Secondary | ICD-10-CM | POA: Diagnosis not present

## 2023-05-09 DIAGNOSIS — D485 Neoplasm of uncertain behavior of skin: Secondary | ICD-10-CM | POA: Diagnosis not present

## 2023-05-09 DIAGNOSIS — L821 Other seborrheic keratosis: Secondary | ICD-10-CM | POA: Diagnosis not present

## 2023-05-09 DIAGNOSIS — Z85828 Personal history of other malignant neoplasm of skin: Secondary | ICD-10-CM | POA: Diagnosis not present

## 2023-05-09 DIAGNOSIS — C4442 Squamous cell carcinoma of skin of scalp and neck: Secondary | ICD-10-CM | POA: Diagnosis not present

## 2023-05-09 DIAGNOSIS — D2261 Melanocytic nevi of right upper limb, including shoulder: Secondary | ICD-10-CM | POA: Diagnosis not present

## 2023-05-09 DIAGNOSIS — D2272 Melanocytic nevi of left lower limb, including hip: Secondary | ICD-10-CM | POA: Diagnosis not present

## 2023-05-10 DIAGNOSIS — I2510 Atherosclerotic heart disease of native coronary artery without angina pectoris: Secondary | ICD-10-CM | POA: Diagnosis not present

## 2023-05-10 DIAGNOSIS — R079 Chest pain, unspecified: Secondary | ICD-10-CM | POA: Diagnosis not present

## 2023-05-10 DIAGNOSIS — N1831 Chronic kidney disease, stage 3a: Secondary | ICD-10-CM | POA: Diagnosis not present

## 2023-05-10 DIAGNOSIS — E782 Mixed hyperlipidemia: Secondary | ICD-10-CM | POA: Diagnosis not present

## 2023-05-10 DIAGNOSIS — I25118 Atherosclerotic heart disease of native coronary artery with other forms of angina pectoris: Secondary | ICD-10-CM | POA: Diagnosis not present

## 2023-05-10 DIAGNOSIS — I2089 Other forms of angina pectoris: Secondary | ICD-10-CM | POA: Diagnosis not present

## 2023-05-10 DIAGNOSIS — I350 Nonrheumatic aortic (valve) stenosis: Secondary | ICD-10-CM | POA: Diagnosis not present

## 2023-05-10 DIAGNOSIS — R0602 Shortness of breath: Secondary | ICD-10-CM | POA: Diagnosis not present

## 2023-05-21 DIAGNOSIS — M5441 Lumbago with sciatica, right side: Secondary | ICD-10-CM | POA: Diagnosis not present

## 2023-05-21 DIAGNOSIS — G8929 Other chronic pain: Secondary | ICD-10-CM | POA: Diagnosis not present

## 2023-05-25 ENCOUNTER — Inpatient Hospital Stay
Admission: RE | Admit: 2023-05-25 | Discharge: 2023-05-25 | Disposition: A | Discharge status: Home / Self Care | Payer: Self-pay | Source: Ambulatory Visit | Attending: Neurosurgery | Admitting: Neurosurgery

## 2023-05-25 ENCOUNTER — Other Ambulatory Visit: Payer: Self-pay

## 2023-05-25 DIAGNOSIS — Z049 Encounter for examination and observation for unspecified reason: Secondary | ICD-10-CM

## 2023-05-25 NOTE — Progress Notes (Unsigned)
Referring Physician:  Kandyce Rud, MD [phone] [address] [facility] [place],  [place] [address]  Primary Physician:  James Rud, MD  History of Present Illness: 05/28/2023 [name] is here today with a chief complaint of right lower extremity pain.  He was in his usual state of health until approximately 2 months ago had a significant fall dislocating 2 of his fingers on the right and hitting his right posterior hip/leg.  He ever since then he has been having pain radiating down his leg.  He states that the majority of his pain is from his knee into the anterior distribution down to the top of his foot.  He does get some radiation from his buttocks down, but reiterates that the majority of his symptoms are from the knee down.  He feels tingling.  He gets worse with bending, reaching, movements and certain activities.  He does feel better if he utilizes heat.  Not having any bowel or bladder dysfunction.  No new foot drop.   Conservative measures:  Physical therapy: Initial visit on 05/21/2023 at [facility]  Multimodal medical therapy including regular antiinflammatories: Tizanidine, Prednisone, Tylenol  Injections: has not received epidural steroid injections  I have utilized the care everywhere function in epic to review the outside records available from external health systems.  Review of Systems:  A 10 point review of systems is negative, except for the pertinent positives and negatives detailed in the HPI.  Past Medical History: Past Medical History:  Diagnosis Date   Basal cell carcinoma    Coronary artery disease of native artery of native heart with stable angina pectoris (HCC)    Hyperlipidemia    Melanoma (HCC)    Moderate to severe aortic stenosis    Polyp of colon, villous adenoma    Prostate cancer (HCC)    Right carotid bruit    Right carotid bruit    Stage 3a chronic kidney disease (CKD) (HCC)     Past Surgical  History: Past Surgical History:  Procedure Laterality Date   CARDIAC CATHETERIZATION     COLONOSCOPY     2003, 2006, 2009, 2014   INSERTION PROSTATE RADIATION SEED     MELANOMA EXCISION  09/2010   ROTATOR CUFF REPAIR     TONSILLECTOMY      Allergies: Allergies as of 05/28/2023   (No Known Allergies)    Medications:  Current Outpatient Medications:    aspirin EC 81 MG tablet, Take 81 mg by mouth daily., Disp: , Rfl:    brimonidine-timolol (COMBIGAN) 0.2-0.5 % ophthalmic solution, Place 1 drop into both eyes every 12 (twelve) hours., Disp: , Rfl:    buPROPion (WELLBUTRIN XL) 150 MG 24 hr tablet, Take 150 mg by mouth daily., Disp: , Rfl:    ezetimibe (ZETIA) 10 MG tablet, Take 10 mg by mouth daily., Disp: , Rfl:   Social History: Social History   Tobacco Use   Smoking status: Never   Smokeless tobacco: Never  Substance Use Topics   Alcohol use: Never   Drug use: Never    Family Medical History: No family history on file.  Physical Examination: Vitals:   05/28/23 0944  BP: 130/82    General: Patient is in no apparent distress. Attention to examination is appropriate.  Neck:   Supple.  Full range of motion.  Respiratory: Patient is breathing without any difficulty.   NEUROLOGICAL:     Awake, alert, oriented to person, place, and  time.  Speech is clear and fluent.   Cranial Nerves: Pupils equal round and reactive to light.  Facial tone is symmetric.  Facial sensation is symmetric. Shoulder shrug is symmetric. Tongue protrusion is midline.    Strength:  Side Iliopsoas Quads Hamstring PF DF INV EV EHL  R 5 5 5 5 5 5  4+ 4+  L 5 5 5 5 5 5 5 5    Reflexes are present in the bilateral medial hamstrings, he does show some decreased right sided Achilles reflex.  Bilateral upper and lower extremity sensation is intact to light touch he does have a positive Tinel's sign at the fibular neck and the peroneal distribution.     No evidence of dysmetria noted.  Gait is  normal.    Imaging: Narrative & Impression  CLINICAL DATA:  Head trauma, minor (Age >= 65y) possible hemorrhage on CT, further eval   EXAM: MRI HEAD WITHOUT CONTRAST   TECHNIQUE: Multiplanar, multiecho pulse sequences of the brain and surrounding structures were obtained without intravenous contrast.   COMPARISON:  CT head from today.   FINDINGS: Brain: Mildly expansile 1.3 cm area of T2/FLAIR hyperintensity in the high right posterior subcortical frontal white matter. This has DWI hyperintensity but has hyperintense ADC signal, compatible with T2 shine through. No acute hemorrhage,, acute infarct, hydrocephalus, extra-axial collection or mass lesion.   Vascular: Major arterial flow voids are maintained.   Skull and upper cervical spine: Normal marrow signal.   Sinuses/Orbits: Clear sinuses.  No acute orbital findings.   Other: Small right mastoid effusion.   IMPRESSION: Mildly expansile 1.3 cm area of T2/FLAIR hyperintensity in the high right posterior subcortical frontal white matter. Primary differential considerations include focal cortical dysplasia, low-grade glioma, or less likely subacute infarct. Recommend MRI with contrast in 3-4 months to assess stability.     Electronically Signed   By: James West M.D.   On: 03/29/2023 16:15       I have personally reviewed the images and agree with the above interpretation.  Medical Decision Making/Assessment and Plan: [name] is a pleasant 89 y.o. male with recent fall with some significant injuries including 2 dislocated fingers which were closed reduced in the emergency department.  He states that he also hit his right buttock/hip and ever since that time has had significant tingling pain going down his leg.  He states that this is worse from the knee down and appears to be in a peroneal distribution.  He states that he also gets some radiation from his hip down but most of this is again from the knee.  He has not  lost any sensation.  He does have some mild weakness on exam mostly in a peroneal distribution with decreased EHL and eversion weakness.  This is very mild however, 4+ out of 5.  He has decreased reflexes at the Achilles.  Given the symptoms and his mechanism concern for possible sciatic nerve issue versus a peroneal nerve issue.  Would like to make a referral for an EMG and follow-up afterwards to discuss the ongoing changes in his right lower extremity which could be consistent with a sciatic neuropathy versus peroneal neuropathy.  Less likely to be a lumbar plexopathy or a lumbar radiculopathy.  Given his current neuropathic issues, would be reasonable to consider medications such as Lyrica or gabapentin, however will defer to his primary care provider given his age  Thank you for involving me in the care of this patient.    Apolinar Junes  Malissa Hippo MD/MSCR Neurosurgery   Problem List Items Addressed This Visit   None Visit Diagnoses     Sciatic neuropathy, right    -  Primary   Relevant Medications   buPROPion (WELLBUTRIN XL) 150 MG 24 hr tablet   Other Relevant Orders   Ambulatory referral to Neurology   Neuropathy of right peroneal nerve       Relevant Medications   buPROPion (WELLBUTRIN XL) 150 MG 24 hr tablet   Other Relevant Orders   Ambulatory referral to Neurology   Right leg pain       Relevant Orders   Ambulatory referral to Neurology

## 2023-05-28 ENCOUNTER — Ambulatory Visit: Payer: PPO | Admitting: Neurosurgery

## 2023-05-28 ENCOUNTER — Encounter: Payer: Self-pay | Admitting: Neurosurgery

## 2023-05-28 VITALS — BP 130/82 | Ht 71.0 in | Wt 158.0 lb

## 2023-05-28 DIAGNOSIS — G5701 Lesion of sciatic nerve, right lower limb: Secondary | ICD-10-CM | POA: Diagnosis not present

## 2023-05-28 DIAGNOSIS — G5731 Lesion of lateral popliteal nerve, right lower limb: Secondary | ICD-10-CM

## 2023-05-28 DIAGNOSIS — M79604 Pain in right leg: Secondary | ICD-10-CM | POA: Diagnosis not present

## 2023-05-28 DIAGNOSIS — W19XXXA Unspecified fall, initial encounter: Secondary | ICD-10-CM

## 2023-06-18 DIAGNOSIS — H35373 Puckering of macula, bilateral: Secondary | ICD-10-CM | POA: Diagnosis not present

## 2023-06-18 DIAGNOSIS — H401133 Primary open-angle glaucoma, bilateral, severe stage: Secondary | ICD-10-CM | POA: Diagnosis not present

## 2023-06-18 DIAGNOSIS — Z961 Presence of intraocular lens: Secondary | ICD-10-CM | POA: Diagnosis not present

## 2023-06-20 DIAGNOSIS — D045 Carcinoma in situ of skin of trunk: Secondary | ICD-10-CM | POA: Diagnosis not present

## 2023-06-20 DIAGNOSIS — C4442 Squamous cell carcinoma of skin of scalp and neck: Secondary | ICD-10-CM | POA: Diagnosis not present

## 2023-06-29 ENCOUNTER — Ambulatory Visit: Admission: RE | Admit: 2023-06-29 | Payer: PPO | Source: Ambulatory Visit

## 2023-07-07 ENCOUNTER — Ambulatory Visit
Admission: RE | Admit: 2023-07-07 | Discharge: 2023-07-07 | Disposition: A | Discharge status: Home / Self Care | Payer: PPO | Source: Ambulatory Visit | Attending: Neurosurgery | Admitting: Neurosurgery

## 2023-07-07 DIAGNOSIS — D496 Neoplasm of unspecified behavior of brain: Secondary | ICD-10-CM | POA: Diagnosis not present

## 2023-07-07 DIAGNOSIS — G939 Disorder of brain, unspecified: Secondary | ICD-10-CM | POA: Diagnosis not present

## 2023-07-07 DIAGNOSIS — R9089 Other abnormal findings on diagnostic imaging of central nervous system: Secondary | ICD-10-CM | POA: Diagnosis not present

## 2023-07-07 MED ORDER — GADOBUTROL 1 MMOL/ML IV SOLN
7.0000 mL | Freq: Once | INTRAVENOUS | Status: AC | PRN
  Administered 2023-07-07: 7 mL via INTRAVENOUS

## 2023-07-16 ENCOUNTER — Ambulatory Visit: Payer: PPO | Admitting: Neurosurgery

## 2023-07-16 ENCOUNTER — Encounter: Payer: Self-pay | Admitting: Neurosurgery

## 2023-07-16 VITALS — BP 122/78 | Ht 71.0 in | Wt 158.0 lb

## 2023-07-16 DIAGNOSIS — S06369A Traumatic hemorrhage of cerebrum, unspecified, with loss of consciousness of unspecified duration, initial encounter: Secondary | ICD-10-CM | POA: Insufficient documentation

## 2023-07-16 DIAGNOSIS — S06369D Traumatic hemorrhage of cerebrum, unspecified, with loss of consciousness of unspecified duration, subsequent encounter: Secondary | ICD-10-CM | POA: Diagnosis not present

## 2023-07-16 DIAGNOSIS — W19XXXD Unspecified fall, subsequent encounter: Secondary | ICD-10-CM

## 2023-07-16 NOTE — Progress Notes (Signed)
Primary Physician:  Kandyce Rud, MD  Chief Complaint:  ICH  History of Present Illness: 07/16/2023 EVERTH West is a 89 y.o. male who presents with the chief complaint of intracerebral hemorrhage.  He was seen in our office previously and was following up on a fall he sustained an September.  He struck his head but most of his trauma was on his hands.  Overall he states that he is recovering well.  Not having any headaches.  No numbness weakness or tingling.  No new neurologic deficits.  He states that he does have some intermittent difficulties remembering names, however he feels that this is likely related to his age.  Review of Systems:  A 10 point review of systems is negative, except for the pertinent positives and negatives detailed in the HPI.  Past Medical History: Past Medical History:  Diagnosis Date   Basal cell carcinoma    Coronary artery disease of native artery of native heart with stable angina pectoris (HCC)    Hyperlipidemia    Melanoma (HCC)    Moderate to severe aortic stenosis    Polyp of colon, villous adenoma    Prostate cancer (HCC)    Right carotid bruit    Right carotid bruit    Stage 3a chronic kidney disease (CKD) (HCC)     Past Surgical History: Past Surgical History:  Procedure Laterality Date   CARDIAC CATHETERIZATION     COLONOSCOPY     2003, 2006, 2009, 2014   INSERTION PROSTATE RADIATION SEED     MELANOMA EXCISION  09/2010   ROTATOR CUFF REPAIR     TONSILLECTOMY      Allergies: Allergies as of 07/16/2023   (No Known Allergies)    Medications:  Current Outpatient Medications:    aspirin EC 81 MG tablet, Take 81 mg by mouth daily., Disp: , Rfl:    brimonidine-timolol (COMBIGAN) 0.2-0.5 % ophthalmic solution, Place 1 drop into both eyes every 12 (twelve) hours., Disp: , Rfl:    ibuprofen (ADVIL) 200 MG tablet, Take 200 mg by mouth every 6 (six) hours as needed., Disp: , Rfl:    Social History: Social History   Tobacco Use    Smoking status: Never   Smokeless tobacco: Never  Substance Use Topics   Alcohol use: Never   Drug use: Never    Family Medical History: History reviewed. No pertinent family history.  Physical Examination: Vitals:   07/16/23 1021  BP: 122/78     General: Patient is well developed, well nourished, calm, collected, and in no apparent distress.  NEUROLOGICAL:  General: In no acute distress.   Awake, alert, oriented to person, place, and time.  Pupils equal round and reactive to light.  Full Facial tone is symmetric.  Tongue protrusion is midline.  Bilateral upper extremities are full strength proximally and distally.  There is no pronator drift.  Language is conversant.  GCS:15   Bilateral upper and lower extremity sensation is intact to light touch.  Imaging: Narrative & Impression  CLINICAL DATA:  Brain mass follow-up   EXAM: MRI HEAD WITHOUT AND WITH CONTRAST   TECHNIQUE: Multiplanar, multiecho pulse sequences of the brain and surrounding structures were obtained without and with intravenous contrast.   CONTRAST:  7mL GADAVIST GADOBUTROL 1 MMOL/ML IV SOLN   COMPARISON:  03/29/2023   FINDINGS: Brain: No acute infarct. Unchanged appearance of 1.2 cm focus of hyperintense T2-weighted signal within the posterior right frontal lobe. There is a central focus of magnetic susceptibility  effect suggesting remote hemorrhage. Mild volume loss. Multifocal hyperintense T2-weighted signal the white matter. The midline structures are normal. There is no abnormal contrast enhancement.   Vascular: Normal flow voids.   Skull and upper cervical spine: Normal calvarium and skull base. Visualized upper cervical spine and soft tissues are normal.   Sinuses/Orbits:Right mastoid fluid. Ocular lens replacements. Paranasal sinuses are clear.   IMPRESSION: 1. Unchanged appearance of 1.2 cm focus of hyperintense T2-weighted signal within the posterior right frontal lobe with central  focus of magnetic susceptibility effect suggesting remote hemorrhage. A low-grade neoplasm or focus of chronic demyelination remain possibilities. Continued follow-up in 3-6 months should be considered to confirm stability. 2. No acute intracranial abnormality.     Electronically Signed   By: Deatra Robinson M.D.   On: 07/14/2023 01:12     I have personally reviewed the images and agree with the above interpretation.     Assessment and Plan: Mr. West is a pleasant 89 y.o. male with history of a head trauma with a small focus of ICH.  Overall he has been asymptomatic.  No headaches weakness numbness or tingling.  No new neurologic deficits.  On physical exam he shows no localizing signs or symptoms.  His MRI was evaluated showed stability of a 1.2 cm focus of likely hemorrhage in the posterior right frontal lobe.  This appears stable when compared to the last image.  MRI/radiology requested a follow-up in 3 to 6 months.  We discussed with the patient, he would like to go forward with a 32-month follow-up with the MRI with and without contrast and a 70-month follow-up in clinic to go over the results.  We spent a total of 20 minutes discussing his case, going over his imaging, reviewing his notes, and coordinating his care for his next appointment and follow-up.  Lovenia Kim, MD/MSCR Dept. of Neurosurgery     Problem List Items Addressed This Visit     Traumatic intracerebral hemorrhage with loss of consciousness [facility]) - Primary   Relevant Orders   MR BRAIN W WO CONTRAST

## 2023-08-02 DIAGNOSIS — I350 Nonrheumatic aortic (valve) stenosis: Secondary | ICD-10-CM | POA: Diagnosis not present

## 2023-08-02 DIAGNOSIS — E782 Mixed hyperlipidemia: Secondary | ICD-10-CM | POA: Diagnosis not present

## 2023-08-02 DIAGNOSIS — I2089 Other forms of angina pectoris: Secondary | ICD-10-CM | POA: Diagnosis not present

## 2023-08-02 DIAGNOSIS — R0602 Shortness of breath: Secondary | ICD-10-CM | POA: Diagnosis not present

## 2023-08-02 DIAGNOSIS — I2510 Atherosclerotic heart disease of native coronary artery without angina pectoris: Secondary | ICD-10-CM | POA: Diagnosis not present

## 2023-08-02 DIAGNOSIS — N1831 Chronic kidney disease, stage 3a: Secondary | ICD-10-CM | POA: Diagnosis not present

## 2023-08-02 DIAGNOSIS — I25118 Atherosclerotic heart disease of native coronary artery with other forms of angina pectoris: Secondary | ICD-10-CM | POA: Diagnosis not present

## 2023-08-02 DIAGNOSIS — R0789 Other chest pain: Secondary | ICD-10-CM | POA: Diagnosis not present

## 2023-08-03 ENCOUNTER — Other Ambulatory Visit: Payer: Self-pay | Admitting: Internal Medicine

## 2023-08-03 ENCOUNTER — Telehealth (HOSPITAL_COMMUNITY): Payer: Self-pay | Admitting: *Deleted

## 2023-08-03 DIAGNOSIS — R079 Chest pain, unspecified: Secondary | ICD-10-CM

## 2023-08-03 NOTE — Telephone Encounter (Signed)
 Reaching out to patient to offer assistance regarding upcoming cardiac imaging study; pt verbalizes understanding of appt date/time, parking situation and where to check in, pre-test NPO status and verified current allergies; name and call back number provided for further questions should they arise  Chantal Requena RN Navigator Cardiac Imaging [facility] [phone] office [phone] cell  Patient confirms HR of 60 bpm.

## 2023-08-06 ENCOUNTER — Ambulatory Visit
Admission: RE | Admit: 2023-08-06 | Discharge: 2023-08-06 | Disposition: A | Discharge status: Home / Self Care | Payer: PPO | Source: Ambulatory Visit | Attending: Internal Medicine | Admitting: Internal Medicine

## 2023-08-06 DIAGNOSIS — R079 Chest pain, unspecified: Secondary | ICD-10-CM | POA: Diagnosis not present

## 2023-08-06 DIAGNOSIS — I2510 Atherosclerotic heart disease of native coronary artery without angina pectoris: Secondary | ICD-10-CM | POA: Insufficient documentation

## 2023-08-06 LAB — POCT I-STAT CREATININE: Creatinine, Ser: 1.4 mg/dL — ABNORMAL HIGH (ref 0.61–1.24)

## 2023-08-06 MED ORDER — IOHEXOL 350 MG/ML SOLN
100.0000 mL | Freq: Once | INTRAVENOUS | Status: AC | PRN
  Administered 2023-08-06: 100 mL via INTRAVENOUS

## 2023-08-06 MED ORDER — NITROGLYCERIN 0.4 MG SL SUBL
0.8000 mg | SUBLINGUAL_TABLET | Freq: Once | SUBLINGUAL | Status: AC
Start: 2023-08-06 — End: 2023-08-06
  Administered 2023-08-06: 0.4 mg via SUBLINGUAL
  Filled 2023-08-06: qty 25

## 2023-08-06 MED ORDER — DILTIAZEM HCL 25 MG/5ML IV SOLN: 10.0000 mg | INTRAVENOUS | Status: DC | PRN

## 2023-08-06 MED ORDER — METOPROLOL TARTRATE 5 MG/5ML IV SOLN
10.0000 mg | INTRAVENOUS | Status: DC | PRN
  Filled 2023-08-06: qty 10

## 2023-08-06 NOTE — Progress Notes (Signed)
 Patient tolerated procedure well. Ambulate w/o difficulty. Denies any lightheadedness or being dizzy. Pt denies any pain at this time. Sitting in chair. Pt is encouraged to drink additional water throughout the day and reason explained to patient. Patient verbalized understanding and all questions answered. ABC intact. No further needs at this time. Discharge from procedure area w/o issues.

## 2023-08-15 DIAGNOSIS — N1831 Chronic kidney disease, stage 3a: Secondary | ICD-10-CM | POA: Diagnosis not present

## 2023-08-15 DIAGNOSIS — R0602 Shortness of breath: Secondary | ICD-10-CM | POA: Diagnosis not present

## 2023-08-15 DIAGNOSIS — I2089 Other forms of angina pectoris: Secondary | ICD-10-CM | POA: Diagnosis not present

## 2023-08-15 DIAGNOSIS — I350 Nonrheumatic aortic (valve) stenosis: Secondary | ICD-10-CM | POA: Diagnosis not present

## 2023-08-15 DIAGNOSIS — E782 Mixed hyperlipidemia: Secondary | ICD-10-CM | POA: Diagnosis not present

## 2023-08-15 DIAGNOSIS — R079 Chest pain, unspecified: Secondary | ICD-10-CM | POA: Diagnosis not present

## 2023-08-15 DIAGNOSIS — I25118 Atherosclerotic heart disease of native coronary artery with other forms of angina pectoris: Secondary | ICD-10-CM | POA: Diagnosis not present

## 2023-08-15 DIAGNOSIS — I2510 Atherosclerotic heart disease of native coronary artery without angina pectoris: Secondary | ICD-10-CM | POA: Diagnosis not present

## 2023-08-22 ENCOUNTER — Encounter
Admission: RE | Disposition: A | Discharge status: Home / Self Care | Payer: Self-pay | Source: Home / Self Care | Attending: Internal Medicine

## 2023-08-22 ENCOUNTER — Encounter: Payer: Self-pay | Admitting: Internal Medicine

## 2023-08-22 ENCOUNTER — Other Ambulatory Visit: Payer: Self-pay

## 2023-08-22 ENCOUNTER — Ambulatory Visit
Admission: RE | Admit: 2023-08-22 | Discharge: 2023-08-22 | Disposition: A | Discharge status: Home / Self Care | Payer: PPO | Attending: Internal Medicine | Admitting: Internal Medicine

## 2023-08-22 DIAGNOSIS — I350 Nonrheumatic aortic (valve) stenosis: Secondary | ICD-10-CM | POA: Diagnosis not present

## 2023-08-22 DIAGNOSIS — I25110 Atherosclerotic heart disease of native coronary artery with unstable angina pectoris: Secondary | ICD-10-CM | POA: Insufficient documentation

## 2023-08-22 DIAGNOSIS — R931 Abnormal findings on diagnostic imaging of heart and coronary circulation: Secondary | ICD-10-CM | POA: Diagnosis not present

## 2023-08-22 HISTORY — PX: RIGHT/LEFT HEART CATH AND CORONARY ANGIOGRAPHY: CATH118266

## 2023-08-22 LAB — POCT I-STAT EG7
Acid-Base Excess: 1 mmol/L (ref 0.0–2.0)
Bicarbonate: 26.9 mmol/L (ref 20.0–28.0)
Calcium, Ion: 1.22 mmol/L (ref 1.15–1.40)
HCT: 37 % — ABNORMAL LOW (ref 39.0–52.0)
Hemoglobin: 12.6 g/dL — ABNORMAL LOW (ref 13.0–17.0)
O2 Saturation: 69 %
Potassium: 4 mmol/L (ref 3.5–5.1)
Sodium: 137 mmol/L (ref 135–145)
TCO2: 28 mmol/L (ref 22–32)
pCO2, Ven: 46.2 mm[Hg] (ref 44–60)
pH, Ven: 7.374 (ref 7.25–7.43)
pO2, Ven: 37 mm[Hg] (ref 32–45)

## 2023-08-22 LAB — POCT I-STAT 7, (LYTES, BLD GAS, ICA,H+H)
Acid-Base Excess: 0 mmol/L (ref 0.0–2.0)
Bicarbonate: 25.6 mmol/L (ref 20.0–28.0)
Calcium, Ion: 1.22 mmol/L (ref 1.15–1.40)
HCT: 37 % — ABNORMAL LOW (ref 39.0–52.0)
Hemoglobin: 12.6 g/dL — ABNORMAL LOW (ref 13.0–17.0)
O2 Saturation: 94 %
Potassium: 4 mmol/L (ref 3.5–5.1)
Sodium: 137 mmol/L (ref 135–145)
TCO2: 27 mmol/L (ref 22–32)
pCO2 arterial: 46.4 mm[Hg] (ref 32–48)
pH, Arterial: 7.35 (ref 7.35–7.45)
pO2, Arterial: 73 mm[Hg] — ABNORMAL LOW (ref 83–108)

## 2023-08-22 SURGERY — RIGHT/LEFT HEART CATH AND CORONARY ANGIOGRAPHY
Anesthesia: Moderate Sedation | Laterality: Bilateral

## 2023-08-22 MED ORDER — HEPARIN SODIUM (PORCINE) 1000 UNIT/ML IJ SOLN
INTRAMUSCULAR | Status: DC | PRN
  Administered 2023-08-22: 3500 [IU] via INTRAVENOUS

## 2023-08-22 MED ORDER — HEPARIN (PORCINE) IN NACL 2000-0.9 UNIT/L-% IV SOLN
INTRAVENOUS | Status: DC | PRN
  Administered 2023-08-22: 1000 mL

## 2023-08-22 MED ORDER — MIDAZOLAM HCL 2 MG/2ML IJ SOLN
INTRAMUSCULAR | Status: DC | PRN
  Administered 2023-08-22: 1 mg via INTRAVENOUS

## 2023-08-22 MED ORDER — LIDOCAINE HCL (PF) 1 % IJ SOLN
INTRAMUSCULAR | Status: DC | PRN
  Administered 2023-08-22 (×2): 2 mL

## 2023-08-22 MED ORDER — FENTANYL CITRATE (PF) 100 MCG/2ML IJ SOLN
INTRAMUSCULAR | Status: DC | PRN
  Administered 2023-08-22: 25 ug via INTRAVENOUS

## 2023-08-22 MED ORDER — HYDRALAZINE HCL 20 MG/ML IJ SOLN: 10.0000 mg | Freq: Four times a day (QID) | INTRAMUSCULAR | Status: DC | PRN

## 2023-08-22 MED ORDER — SODIUM CHLORIDE 0.9 % WEIGHT BASED INFUSION: 1.0000 mL/kg/h | INTRAVENOUS | Status: DC

## 2023-08-22 MED ORDER — HEPARIN SODIUM (PORCINE) 1000 UNIT/ML IJ SOLN
INTRAMUSCULAR | Status: AC
  Filled 2023-08-22: qty 10

## 2023-08-22 MED ORDER — LIDOCAINE HCL 1 % IJ SOLN
INTRAMUSCULAR | Status: AC
  Filled 2023-08-22: qty 20

## 2023-08-22 MED ORDER — IOHEXOL 300 MG/ML  SOLN
INTRAMUSCULAR | Status: DC | PRN
  Administered 2023-08-22: 102 mL

## 2023-08-22 MED ORDER — SODIUM CHLORIDE 0.9 % IV SOLN: INTRAVENOUS | Status: DC

## 2023-08-22 MED ORDER — FENTANYL CITRATE (PF) 100 MCG/2ML IJ SOLN
INTRAMUSCULAR | Status: AC
  Filled 2023-08-22: qty 2

## 2023-08-22 MED ORDER — VERAPAMIL HCL 2.5 MG/ML IV SOLN
INTRAVENOUS | Status: AC
  Filled 2023-08-22: qty 2

## 2023-08-22 MED ORDER — SODIUM CHLORIDE 0.9% FLUSH: 3.0000 mL | INTRAVENOUS | Status: DC | PRN

## 2023-08-22 MED ORDER — HEPARIN (PORCINE) IN NACL 1000-0.9 UT/500ML-% IV SOLN
INTRAVENOUS | Status: AC
  Filled 2023-08-22: qty 1000

## 2023-08-22 MED ORDER — VERAPAMIL HCL 2.5 MG/ML IV SOLN
INTRAVENOUS | Status: DC | PRN
  Administered 2023-08-22: 2.5 mg via INTRA_ARTERIAL

## 2023-08-22 MED ORDER — SODIUM CHLORIDE 0.9% FLUSH: 3.0000 mL | Freq: Two times a day (BID) | INTRAVENOUS | Status: DC

## 2023-08-22 MED ORDER — SODIUM CHLORIDE 0.9 % IV SOLN: 250.0000 mL | INTRAVENOUS | Status: DC | PRN

## 2023-08-22 MED ORDER — ASPIRIN 81 MG PO CHEW
CHEWABLE_TABLET | ORAL | Status: AC
Start: 2023-08-22 — End: ?
  Filled 2023-08-22: qty 1

## 2023-08-22 MED ORDER — MIDAZOLAM HCL 2 MG/2ML IJ SOLN
INTRAMUSCULAR | Status: AC
  Filled 2023-08-22: qty 2

## 2023-08-22 MED ORDER — ASPIRIN 81 MG PO CHEW
81.0000 mg | CHEWABLE_TABLET | ORAL | Status: AC
  Administered 2023-08-22: 81 mg via ORAL

## 2023-08-22 SURGICAL SUPPLY — 13 items
CATH 5FR JL3.5 JR4 ANG PIG MP (CATHETERS) IMPLANT
CATH BALLN WEDGE 5F 110CM (CATHETERS) IMPLANT
DEVICE RAD TR BAND REGULAR (VASCULAR PRODUCTS) IMPLANT
DRAPE BRACHIAL (DRAPES) IMPLANT
GLIDESHEATH SLEND SS 6F .021 (SHEATH) IMPLANT
GUIDEWIRE EMER 3M J .025X150CM (WIRE) IMPLANT
GUIDEWIRE INQWIRE 1.5J.035X260 (WIRE) IMPLANT
INQWIRE 1.5J .035X260CM (WIRE) ×1
PACK CARDIAC CATH (CUSTOM PROCEDURE TRAY) ×1 IMPLANT
PROTECTION STATION PRESSURIZED (MISCELLANEOUS) ×1
SET ATX-X65L (MISCELLANEOUS) IMPLANT
SHEATH GLIDE SLENDER 4/5FR (SHEATH) IMPLANT
STATION PROTECTION PRESSURIZED (MISCELLANEOUS) IMPLANT

## 2023-08-22 NOTE — Discharge Instructions (Signed)
Radial Site Care Refer to this sheet in the next few weeks. These instructions provide you with information about caring for yourself after your procedure. Your health care provider may also give you more specific instructions. Your treatment has been planned according to current medical practices, but problems sometimes occur. Call your health care provider if you have any problems or questions after your procedure. What can I expect after the procedure? After your procedure, it is typical to have the following: Bruising at the radial site that usually fades within 1-2 weeks. Blood collecting in the tissue (hematoma) that may be painful to the touch. It should usually decrease in size and tenderness within 1-2 weeks.  Follow these instructions at home: Take medicines only as directed by your health care provider. If you are on a medication called Metformin please do not take for 48 hours after your procedure. Over the next 48hrs please increase your fluid intake of water and non caffeine beverages to flush the contrast dye out of your system.  You may shower 24 hours after the procedure  Leave your bandage on and gently wash the site with plain soap and water. Pat the area dry with a clean towel. Do not rub the site, because this may cause bleeding.  Remove your dressing 48hrs after your procedure and leave open to air.  Do not submerge your site in water for 7 days. This includes swimming and washing dishes.  Check your insertion site every day for redness, swelling, or drainage. Do not apply powder or lotion to the site. Do not flex or bend the affected arm for 24 hours or as directed by your health care provider. Do not push or pull heavy objects with the affected arm for 24 hours or as directed by your health care provider. Do not lift over 10 lb (4.5 kg) for 5 days after your procedure or as directed by your health care provider. Ask your health care provider when it is okay to: Return to  work or school. Resume usual physical activities or sports. Resume sexual activity. Do not drive home if you are discharged the same day as the procedure. Have someone else drive you. You may drive 48 hours after the procedure Do not operate machinery or power tools for 24 hours after the procedure. If your procedure was done as an outpatient procedure, which means that you went home the same day as your procedure, a responsible adult should be with you for the first 24 hours after you arrive home. Keep all follow-up visits as directed by your health care provider. This is important. Contact a health care provider if: You have a fever. You have chills. You have increased bleeding from the radial site. Hold pressure on the site. Get help right away if: You have unusual pain at the radial site. You have redness, warmth, or swelling at the radial site. You have drainage (other than a small amount of blood on the dressing) from the radial site. The radial site is bleeding, and the bleeding does not stop after 15 minutes of holding steady pressure on the site. Your arm or hand becomes pale, cool, tingly, or numb. This information is not intended to replace advice given to you by your health care provider. Make sure you discuss any questions you have with your health care provider. Document Released: 08/12/2010 Document Revised: 12/16/2015 Document Reviewed: 01/26/2014 Elsevier Interactive Patient Education  2018 [employer]Radial Site Care Refer to this sheet in the next few  weeks. These instructions provide you with information about caring for yourself after your procedure. Your health care provider may also give you more specific instructions. Your treatment has been planned according to current medical practices, but problems sometimes occur. Call your health care provider if you have any problems or questions after your procedure. What can I expect after the procedure? After your procedure,  it is typical to have the following: Bruising at the radial site that usually fades within 1-2 weeks. Blood collecting in the tissue (hematoma) that may be painful to the touch. It should usually decrease in size and tenderness within 1-2 weeks.  Follow these instructions at home: Take medicines only as directed by your health care provider. If you are on a medication called Metformin please do not take for 48 hours after your procedure. Over the next 48hrs please increase your fluid intake of water and non caffeine beverages to flush the contrast dye out of your system.  You may shower 24 hours after the procedure  Leave your bandage on and gently wash the site with plain soap and water. Pat the area dry with a clean towel. Do not rub the site, because this may cause bleeding.  Remove your dressing 48hrs after your procedure and leave open to air.  Do not submerge your site in water for 7 days. This includes swimming and washing dishes.  Check your insertion site every day for redness, swelling, or drainage. Do not apply powder or lotion to the site. Do not flex or bend the affected arm for 24 hours or as directed by your health care provider. Do not push or pull heavy objects with the affected arm for 24 hours or as directed by your health care provider. Do not lift over 10 lb (4.5 kg) for 5 days after your procedure or as directed by your health care provider. Ask your health care provider when it is okay to: Return to work or school. Resume usual physical activities or sports. Resume sexual activity. Do not drive home if you are discharged the same day as the procedure. Have someone else drive you. You may drive 48 hours after the procedure Do not operate machinery or power tools for 24 hours after the procedure. If your procedure was done as an outpatient procedure, which means that you went home the same day as your procedure, a responsible adult should be with you for the first 24 hours  after you arrive home. Keep all follow-up visits as directed by your health care provider. This is important. Contact a health care provider if: You have a fever. You have chills. You have increased bleeding from the radial site. Hold pressure on the site. Get help right away if: You have unusual pain at the radial site. You have redness, warmth, or swelling at the radial site. You have drainage (other than a small amount of blood on the dressing) from the radial site. The radial site is bleeding, and the bleeding does not stop after 15 minutes of holding steady pressure on the site. Your arm or hand becomes pale, cool, tingly, or numb. This information is not intended to replace advice given to you by your health care provider. Make sure you discuss any questions you have with your health care provider. Document Released: 08/12/2010 Document Revised: 12/16/2015 Document Reviewed: 01/26/2014 Elsevier Interactive Patient Education  2018 [employer].

## 2023-08-23 ENCOUNTER — Encounter: Payer: Self-pay | Admitting: Internal Medicine

## 2023-08-23 LAB — CARDIAC CATHETERIZATION: Cath EF Quantitative: 55 %

## 2023-08-27 DIAGNOSIS — I2089 Other forms of angina pectoris: Secondary | ICD-10-CM | POA: Diagnosis not present

## 2023-08-27 DIAGNOSIS — I2510 Atherosclerotic heart disease of native coronary artery without angina pectoris: Secondary | ICD-10-CM | POA: Diagnosis not present

## 2023-08-27 DIAGNOSIS — R0602 Shortness of breath: Secondary | ICD-10-CM | POA: Diagnosis not present

## 2023-08-27 DIAGNOSIS — N1831 Chronic kidney disease, stage 3a: Secondary | ICD-10-CM | POA: Diagnosis not present

## 2023-08-27 DIAGNOSIS — E782 Mixed hyperlipidemia: Secondary | ICD-10-CM | POA: Diagnosis not present

## 2023-08-27 DIAGNOSIS — I25118 Atherosclerotic heart disease of native coronary artery with other forms of angina pectoris: Secondary | ICD-10-CM | POA: Diagnosis not present

## 2023-08-27 DIAGNOSIS — I350 Nonrheumatic aortic (valve) stenosis: Secondary | ICD-10-CM | POA: Diagnosis not present

## 2023-08-27 DIAGNOSIS — Z9889 Other specified postprocedural states: Secondary | ICD-10-CM | POA: Diagnosis not present

## 2023-09-06 DIAGNOSIS — I700 Atherosclerosis of aorta: Secondary | ICD-10-CM | POA: Diagnosis not present

## 2023-09-06 DIAGNOSIS — J90 Pleural effusion, not elsewhere classified: Secondary | ICD-10-CM | POA: Diagnosis not present

## 2023-09-06 DIAGNOSIS — K8689 Other specified diseases of pancreas: Secondary | ICD-10-CM | POA: Diagnosis not present

## 2023-09-06 DIAGNOSIS — I2510 Atherosclerotic heart disease of native coronary artery without angina pectoris: Secondary | ICD-10-CM | POA: Diagnosis not present

## 2023-09-06 DIAGNOSIS — I350 Nonrheumatic aortic (valve) stenosis: Secondary | ICD-10-CM | POA: Diagnosis not present

## 2023-09-06 DIAGNOSIS — Z01810 Encounter for preprocedural cardiovascular examination: Secondary | ICD-10-CM | POA: Diagnosis not present

## 2023-09-19 DIAGNOSIS — N183 Chronic kidney disease, stage 3 unspecified: Secondary | ICD-10-CM | POA: Diagnosis not present

## 2023-09-19 DIAGNOSIS — Z01810 Encounter for preprocedural cardiovascular examination: Secondary | ICD-10-CM | POA: Diagnosis not present

## 2023-09-19 DIAGNOSIS — E785 Hyperlipidemia, unspecified: Secondary | ICD-10-CM | POA: Diagnosis not present

## 2023-09-19 DIAGNOSIS — I25110 Atherosclerotic heart disease of native coronary artery with unstable angina pectoris: Secondary | ICD-10-CM | POA: Diagnosis not present

## 2023-09-19 DIAGNOSIS — I350 Nonrheumatic aortic (valve) stenosis: Secondary | ICD-10-CM | POA: Diagnosis not present

## 2023-09-19 DIAGNOSIS — I2584 Coronary atherosclerosis due to calcified coronary lesion: Secondary | ICD-10-CM | POA: Diagnosis not present

## 2023-09-19 DIAGNOSIS — I429 Cardiomyopathy, unspecified: Secondary | ICD-10-CM | POA: Diagnosis not present

## 2023-09-19 DIAGNOSIS — Z7982 Long term (current) use of aspirin: Secondary | ICD-10-CM | POA: Diagnosis not present

## 2023-09-19 DIAGNOSIS — I509 Heart failure, unspecified: Secondary | ICD-10-CM | POA: Diagnosis not present

## 2023-09-19 DIAGNOSIS — Z955 Presence of coronary angioplasty implant and graft: Secondary | ICD-10-CM | POA: Diagnosis not present

## 2023-09-19 DIAGNOSIS — N1831 Chronic kidney disease, stage 3a: Secondary | ICD-10-CM | POA: Diagnosis not present

## 2023-09-19 DIAGNOSIS — I25118 Atherosclerotic heart disease of native coronary artery with other forms of angina pectoris: Secondary | ICD-10-CM | POA: Diagnosis not present

## 2023-09-27 DIAGNOSIS — E7800 Pure hypercholesterolemia, unspecified: Secondary | ICD-10-CM | POA: Diagnosis not present

## 2023-09-27 DIAGNOSIS — R7303 Prediabetes: Secondary | ICD-10-CM | POA: Diagnosis not present

## 2023-09-27 DIAGNOSIS — N1831 Chronic kidney disease, stage 3a: Secondary | ICD-10-CM | POA: Diagnosis not present

## 2023-09-27 DIAGNOSIS — Z79899 Other long term (current) drug therapy: Secondary | ICD-10-CM | POA: Diagnosis not present

## 2023-10-04 DIAGNOSIS — D649 Anemia, unspecified: Secondary | ICD-10-CM | POA: Diagnosis not present

## 2023-10-04 DIAGNOSIS — I25110 Atherosclerotic heart disease of native coronary artery with unstable angina pectoris: Secondary | ICD-10-CM | POA: Diagnosis not present

## 2023-10-04 DIAGNOSIS — E7800 Pure hypercholesterolemia, unspecified: Secondary | ICD-10-CM | POA: Diagnosis not present

## 2023-10-04 DIAGNOSIS — R7303 Prediabetes: Secondary | ICD-10-CM | POA: Diagnosis not present

## 2023-10-04 DIAGNOSIS — N1831 Chronic kidney disease, stage 3a: Secondary | ICD-10-CM | POA: Diagnosis not present

## 2023-10-04 DIAGNOSIS — C61 Malignant neoplasm of prostate: Secondary | ICD-10-CM | POA: Diagnosis not present

## 2023-11-14 DIAGNOSIS — D696 Thrombocytopenia, unspecified: Secondary | ICD-10-CM | POA: Diagnosis not present

## 2023-11-14 DIAGNOSIS — Z01818 Encounter for other preprocedural examination: Secondary | ICD-10-CM | POA: Diagnosis not present

## 2023-11-14 DIAGNOSIS — I350 Nonrheumatic aortic (valve) stenosis: Secondary | ICD-10-CM | POA: Diagnosis not present

## 2023-11-14 DIAGNOSIS — N1831 Chronic kidney disease, stage 3a: Secondary | ICD-10-CM | POA: Diagnosis not present

## 2023-11-14 DIAGNOSIS — Z95820 Peripheral vascular angioplasty status with implants and grafts: Secondary | ICD-10-CM | POA: Diagnosis not present

## 2023-11-14 DIAGNOSIS — I517 Cardiomegaly: Secondary | ICD-10-CM | POA: Diagnosis not present

## 2023-11-14 DIAGNOSIS — I25110 Atherosclerotic heart disease of native coronary artery with unstable angina pectoris: Secondary | ICD-10-CM | POA: Diagnosis not present

## 2023-11-14 DIAGNOSIS — C61 Malignant neoplasm of prostate: Secondary | ICD-10-CM | POA: Diagnosis not present

## 2023-11-14 DIAGNOSIS — R7303 Prediabetes: Secondary | ICD-10-CM | POA: Diagnosis not present

## 2023-11-14 DIAGNOSIS — E7800 Pure hypercholesterolemia, unspecified: Secondary | ICD-10-CM | POA: Diagnosis not present

## 2023-11-14 DIAGNOSIS — J90 Pleural effusion, not elsewhere classified: Secondary | ICD-10-CM | POA: Diagnosis not present

## 2023-11-15 DIAGNOSIS — I2510 Atherosclerotic heart disease of native coronary artery without angina pectoris: Secondary | ICD-10-CM | POA: Diagnosis not present

## 2023-11-15 DIAGNOSIS — Z952 Presence of prosthetic heart valve: Secondary | ICD-10-CM | POA: Diagnosis not present

## 2023-11-15 DIAGNOSIS — I350 Nonrheumatic aortic (valve) stenosis: Secondary | ICD-10-CM | POA: Diagnosis not present

## 2023-11-15 DIAGNOSIS — J984 Other disorders of lung: Secondary | ICD-10-CM | POA: Diagnosis not present

## 2023-11-15 DIAGNOSIS — Z452 Encounter for adjustment and management of vascular access device: Secondary | ICD-10-CM | POA: Diagnosis not present

## 2023-11-15 DIAGNOSIS — Z79899 Other long term (current) drug therapy: Secondary | ICD-10-CM | POA: Diagnosis not present

## 2023-11-15 DIAGNOSIS — Z8546 Personal history of malignant neoplasm of prostate: Secondary | ICD-10-CM | POA: Diagnosis not present

## 2023-11-15 DIAGNOSIS — I129 Hypertensive chronic kidney disease with stage 1 through stage 4 chronic kidney disease, or unspecified chronic kidney disease: Secondary | ICD-10-CM | POA: Diagnosis not present

## 2023-11-15 DIAGNOSIS — Z85820 Personal history of malignant melanoma of skin: Secondary | ICD-10-CM | POA: Diagnosis not present

## 2023-11-15 DIAGNOSIS — E785 Hyperlipidemia, unspecified: Secondary | ICD-10-CM | POA: Diagnosis not present

## 2023-11-15 DIAGNOSIS — Z7982 Long term (current) use of aspirin: Secondary | ICD-10-CM | POA: Diagnosis not present

## 2023-11-15 DIAGNOSIS — Z955 Presence of coronary angioplasty implant and graft: Secondary | ICD-10-CM | POA: Diagnosis not present

## 2023-11-15 DIAGNOSIS — J811 Chronic pulmonary edema: Secondary | ICD-10-CM | POA: Diagnosis not present

## 2023-11-15 DIAGNOSIS — I25119 Atherosclerotic heart disease of native coronary artery with unspecified angina pectoris: Secondary | ICD-10-CM | POA: Diagnosis not present

## 2023-11-15 DIAGNOSIS — R931 Abnormal findings on diagnostic imaging of heart and coronary circulation: Secondary | ICD-10-CM | POA: Diagnosis not present

## 2023-11-15 DIAGNOSIS — Z006 Encounter for examination for normal comparison and control in clinical research program: Secondary | ICD-10-CM | POA: Diagnosis not present

## 2023-11-15 DIAGNOSIS — R7303 Prediabetes: Secondary | ICD-10-CM | POA: Diagnosis not present

## 2023-11-15 DIAGNOSIS — R918 Other nonspecific abnormal finding of lung field: Secondary | ICD-10-CM | POA: Diagnosis not present

## 2023-11-15 DIAGNOSIS — Z4682 Encounter for fitting and adjustment of non-vascular catheter: Secondary | ICD-10-CM | POA: Diagnosis not present

## 2023-11-15 DIAGNOSIS — Z7902 Long term (current) use of antithrombotics/antiplatelets: Secondary | ICD-10-CM | POA: Diagnosis not present

## 2023-11-15 DIAGNOSIS — N1831 Chronic kidney disease, stage 3a: Secondary | ICD-10-CM | POA: Diagnosis not present

## 2023-11-21 DIAGNOSIS — L259 Unspecified contact dermatitis, unspecified cause: Secondary | ICD-10-CM | POA: Diagnosis not present

## 2023-11-21 DIAGNOSIS — D2261 Melanocytic nevi of right upper limb, including shoulder: Secondary | ICD-10-CM | POA: Diagnosis not present

## 2023-11-21 DIAGNOSIS — D044 Carcinoma in situ of skin of scalp and neck: Secondary | ICD-10-CM | POA: Diagnosis not present

## 2023-11-21 DIAGNOSIS — D225 Melanocytic nevi of trunk: Secondary | ICD-10-CM | POA: Diagnosis not present

## 2023-11-21 DIAGNOSIS — D485 Neoplasm of uncertain behavior of skin: Secondary | ICD-10-CM | POA: Diagnosis not present

## 2023-11-21 DIAGNOSIS — C44519 Basal cell carcinoma of skin of other part of trunk: Secondary | ICD-10-CM | POA: Diagnosis not present

## 2023-11-21 DIAGNOSIS — L820 Inflamed seborrheic keratosis: Secondary | ICD-10-CM | POA: Diagnosis not present

## 2023-11-21 DIAGNOSIS — D2262 Melanocytic nevi of left upper limb, including shoulder: Secondary | ICD-10-CM | POA: Diagnosis not present

## 2023-11-21 DIAGNOSIS — D2272 Melanocytic nevi of left lower limb, including hip: Secondary | ICD-10-CM | POA: Diagnosis not present

## 2023-11-21 DIAGNOSIS — D2271 Melanocytic nevi of right lower limb, including hip: Secondary | ICD-10-CM | POA: Diagnosis not present

## 2023-11-21 DIAGNOSIS — L821 Other seborrheic keratosis: Secondary | ICD-10-CM | POA: Diagnosis not present

## 2023-11-27 DIAGNOSIS — H401133 Primary open-angle glaucoma, bilateral, severe stage: Secondary | ICD-10-CM | POA: Diagnosis not present

## 2023-12-07 DIAGNOSIS — Z961 Presence of intraocular lens: Secondary | ICD-10-CM | POA: Diagnosis not present

## 2023-12-07 DIAGNOSIS — H35373 Puckering of macula, bilateral: Secondary | ICD-10-CM | POA: Diagnosis not present

## 2023-12-07 DIAGNOSIS — H401133 Primary open-angle glaucoma, bilateral, severe stage: Secondary | ICD-10-CM | POA: Diagnosis not present

## 2023-12-18 DIAGNOSIS — Z955 Presence of coronary angioplasty implant and graft: Secondary | ICD-10-CM | POA: Diagnosis not present

## 2023-12-18 DIAGNOSIS — R001 Bradycardia, unspecified: Secondary | ICD-10-CM | POA: Diagnosis not present

## 2023-12-18 DIAGNOSIS — Z95820 Peripheral vascular angioplasty status with implants and grafts: Secondary | ICD-10-CM | POA: Diagnosis not present

## 2023-12-18 DIAGNOSIS — M954 Acquired deformity of chest and rib: Secondary | ICD-10-CM | POA: Diagnosis not present

## 2023-12-18 DIAGNOSIS — Z952 Presence of prosthetic heart valve: Secondary | ICD-10-CM | POA: Diagnosis not present

## 2023-12-18 DIAGNOSIS — I447 Left bundle-branch block, unspecified: Secondary | ICD-10-CM | POA: Diagnosis not present

## 2024-01-09 DIAGNOSIS — C44519 Basal cell carcinoma of skin of other part of trunk: Secondary | ICD-10-CM | POA: Diagnosis not present

## 2024-01-09 DIAGNOSIS — D044 Carcinoma in situ of skin of scalp and neck: Secondary | ICD-10-CM | POA: Diagnosis not present

## 2024-01-09 DIAGNOSIS — L820 Inflamed seborrheic keratosis: Secondary | ICD-10-CM | POA: Diagnosis not present

## 2024-01-09 DIAGNOSIS — L2989 Other pruritus: Secondary | ICD-10-CM | POA: Diagnosis not present

## 2024-01-14 ENCOUNTER — Ambulatory Visit: Payer: PPO | Admitting: Neurosurgery

## 2024-01-14 DIAGNOSIS — I25118 Atherosclerotic heart disease of native coronary artery with other forms of angina pectoris: Secondary | ICD-10-CM | POA: Diagnosis not present

## 2024-01-14 DIAGNOSIS — N1831 Chronic kidney disease, stage 3a: Secondary | ICD-10-CM | POA: Diagnosis not present

## 2024-01-14 DIAGNOSIS — Z952 Presence of prosthetic heart valve: Secondary | ICD-10-CM | POA: Diagnosis not present

## 2024-01-14 DIAGNOSIS — I350 Nonrheumatic aortic (valve) stenosis: Secondary | ICD-10-CM | POA: Diagnosis not present

## 2024-01-14 DIAGNOSIS — I2510 Atherosclerotic heart disease of native coronary artery without angina pectoris: Secondary | ICD-10-CM | POA: Diagnosis not present

## 2024-01-14 DIAGNOSIS — E782 Mixed hyperlipidemia: Secondary | ICD-10-CM | POA: Diagnosis not present

## 2024-01-14 DIAGNOSIS — I2089 Other forms of angina pectoris: Secondary | ICD-10-CM | POA: Diagnosis not present

## 2024-01-14 DIAGNOSIS — R0602 Shortness of breath: Secondary | ICD-10-CM | POA: Diagnosis not present

## 2024-01-14 DIAGNOSIS — Z95820 Peripheral vascular angioplasty status with implants and grafts: Secondary | ICD-10-CM | POA: Diagnosis not present

## 2024-01-18 DIAGNOSIS — M7582 Other shoulder lesions, left shoulder: Secondary | ICD-10-CM | POA: Diagnosis not present

## 2024-01-18 DIAGNOSIS — M75122 Complete rotator cuff tear or rupture of left shoulder, not specified as traumatic: Secondary | ICD-10-CM | POA: Diagnosis not present

## 2024-01-18 DIAGNOSIS — M25512 Pain in left shoulder: Secondary | ICD-10-CM | POA: Diagnosis not present

## 2024-02-21 ENCOUNTER — Other Ambulatory Visit: Payer: Self-pay | Admitting: Surgery

## 2024-02-21 DIAGNOSIS — M7582 Other shoulder lesions, left shoulder: Secondary | ICD-10-CM

## 2024-02-21 DIAGNOSIS — M75122 Complete rotator cuff tear or rupture of left shoulder, not specified as traumatic: Secondary | ICD-10-CM

## 2024-02-22 ENCOUNTER — Ambulatory Visit
Admission: RE | Admit: 2024-02-22 | Discharge: 2024-02-22 | Disposition: A | Discharge status: Home / Self Care | Source: Ambulatory Visit | Attending: Surgery | Admitting: Surgery

## 2024-02-22 DIAGNOSIS — M7582 Other shoulder lesions, left shoulder: Secondary | ICD-10-CM | POA: Diagnosis not present

## 2024-02-22 DIAGNOSIS — M75122 Complete rotator cuff tear or rupture of left shoulder, not specified as traumatic: Secondary | ICD-10-CM | POA: Diagnosis not present

## 2024-02-22 DIAGNOSIS — M129 Arthropathy, unspecified: Secondary | ICD-10-CM | POA: Diagnosis not present

## 2024-02-22 DIAGNOSIS — M25412 Effusion, left shoulder: Secondary | ICD-10-CM | POA: Diagnosis not present

## 2024-02-29 DIAGNOSIS — M7582 Other shoulder lesions, left shoulder: Secondary | ICD-10-CM | POA: Diagnosis not present

## 2024-02-29 DIAGNOSIS — M75122 Complete rotator cuff tear or rupture of left shoulder, not specified as traumatic: Secondary | ICD-10-CM | POA: Diagnosis not present

## 2024-03-19 DIAGNOSIS — M170 Bilateral primary osteoarthritis of knee: Secondary | ICD-10-CM | POA: Diagnosis not present

## 2024-03-19 DIAGNOSIS — G8929 Other chronic pain: Secondary | ICD-10-CM | POA: Diagnosis not present

## 2024-04-03 DIAGNOSIS — N1831 Chronic kidney disease, stage 3a: Secondary | ICD-10-CM | POA: Diagnosis not present

## 2024-04-03 DIAGNOSIS — R7303 Prediabetes: Secondary | ICD-10-CM | POA: Diagnosis not present

## 2024-04-03 DIAGNOSIS — E7800 Pure hypercholesterolemia, unspecified: Secondary | ICD-10-CM | POA: Diagnosis not present

## 2024-04-10 DIAGNOSIS — E7800 Pure hypercholesterolemia, unspecified: Secondary | ICD-10-CM | POA: Diagnosis not present

## 2024-04-10 DIAGNOSIS — R7303 Prediabetes: Secondary | ICD-10-CM | POA: Diagnosis not present

## 2024-04-10 DIAGNOSIS — Z0000 Encounter for general adult medical examination without abnormal findings: Secondary | ICD-10-CM | POA: Diagnosis not present

## 2024-04-10 DIAGNOSIS — Z1331 Encounter for screening for depression: Secondary | ICD-10-CM | POA: Diagnosis not present

## 2024-04-10 DIAGNOSIS — D649 Anemia, unspecified: Secondary | ICD-10-CM | POA: Diagnosis not present

## 2024-04-10 DIAGNOSIS — N1831 Chronic kidney disease, stage 3a: Secondary | ICD-10-CM | POA: Diagnosis not present

## 2024-06-17 DIAGNOSIS — M1711 Unilateral primary osteoarthritis, right knee: Secondary | ICD-10-CM | POA: Diagnosis not present

## 2024-06-17 DIAGNOSIS — M25561 Pain in right knee: Secondary | ICD-10-CM | POA: Diagnosis not present

## 2024-06-17 DIAGNOSIS — M25562 Pain in left knee: Secondary | ICD-10-CM | POA: Diagnosis not present

## 2024-06-17 DIAGNOSIS — M1712 Unilateral primary osteoarthritis, left knee: Secondary | ICD-10-CM | POA: Diagnosis not present

## 2024-06-17 DIAGNOSIS — G8929 Other chronic pain: Secondary | ICD-10-CM | POA: Diagnosis not present

## 2024-07-02 DIAGNOSIS — L821 Other seborrheic keratosis: Secondary | ICD-10-CM | POA: Diagnosis not present

## 2024-07-02 DIAGNOSIS — Z85820 Personal history of malignant melanoma of skin: Secondary | ICD-10-CM | POA: Diagnosis not present

## 2024-07-02 DIAGNOSIS — Z85828 Personal history of other malignant neoplasm of skin: Secondary | ICD-10-CM | POA: Diagnosis not present

## 2024-07-02 DIAGNOSIS — D2261 Melanocytic nevi of right upper limb, including shoulder: Secondary | ICD-10-CM | POA: Diagnosis not present

## 2024-07-02 DIAGNOSIS — D2271 Melanocytic nevi of right lower limb, including hip: Secondary | ICD-10-CM | POA: Diagnosis not present

## 2024-07-02 DIAGNOSIS — D2262 Melanocytic nevi of left upper limb, including shoulder: Secondary | ICD-10-CM | POA: Diagnosis not present

## 2024-07-02 DIAGNOSIS — D225 Melanocytic nevi of trunk: Secondary | ICD-10-CM | POA: Diagnosis not present

## 2024-07-02 DIAGNOSIS — Z08 Encounter for follow-up examination after completed treatment for malignant neoplasm: Secondary | ICD-10-CM | POA: Diagnosis not present

## 2024-07-02 DIAGNOSIS — D2272 Melanocytic nevi of left lower limb, including hip: Secondary | ICD-10-CM | POA: Diagnosis not present

## 2024-07-02 DIAGNOSIS — D485 Neoplasm of uncertain behavior of skin: Secondary | ICD-10-CM | POA: Diagnosis not present

## 2024-07-02 DIAGNOSIS — L538 Other specified erythematous conditions: Secondary | ICD-10-CM | POA: Diagnosis not present

## 2024-08-18 ENCOUNTER — Ambulatory Visit: Admitting: Podiatry
